# Patient Record
Sex: Female | Born: 1985 | Hispanic: No | Marital: Married | Smoking: Never smoker
Health system: Southern US, Community
[De-identification: ages and names within clinical notes are randomized; demographics above are authoritative.]

## PROBLEM LIST (undated history)

## (undated) DIAGNOSIS — E039 Hypothyroidism, unspecified: Secondary | ICD-10-CM

## (undated) DIAGNOSIS — O34219 Maternal care for unspecified type scar from previous cesarean delivery: Secondary | ICD-10-CM

## (undated) DIAGNOSIS — O9902 Anemia complicating childbirth: Secondary | ICD-10-CM

---

## 2010-11-30 LAB — GC/CHLAMYDIA PROBE AMP, GENITAL: Chlamydia: NEGATIVE

## 2010-12-16 LAB — GC/CHLAMYDIA PROBE AMP, GENITAL: Gonorrhea: NEGATIVE

## 2011-03-05 LAB — STREP B DNA PROBE: GBS: POSITIVE

## 2011-04-02 ENCOUNTER — Other Ambulatory Visit: Payer: Self-pay | Admitting: Obstetrics & Gynecology

## 2011-04-02 ENCOUNTER — Encounter (HOSPITAL_COMMUNITY): Payer: Self-pay

## 2011-04-02 ENCOUNTER — Inpatient Hospital Stay (HOSPITAL_COMMUNITY)
Admission: RE | Admit: 2011-04-02 | Discharge: 2011-04-07 | DRG: 765 | Disposition: A | Discharge status: Home / Self Care | Payer: 59 | Source: Ambulatory Visit | Attending: Obstetrics & Gynecology | Admitting: Obstetrics & Gynecology

## 2011-04-02 DIAGNOSIS — O339 Maternal care for disproportion, unspecified: Secondary | ICD-10-CM | POA: Diagnosis present

## 2011-04-02 DIAGNOSIS — O9903 Anemia complicating the puerperium: Secondary | ICD-10-CM | POA: Diagnosis not present

## 2011-04-02 DIAGNOSIS — K838 Other specified diseases of biliary tract: Secondary | ICD-10-CM | POA: Diagnosis present

## 2011-04-02 DIAGNOSIS — K831 Obstruction of bile duct: Secondary | ICD-10-CM | POA: Diagnosis present

## 2011-04-02 DIAGNOSIS — O26619 Liver and biliary tract disorders in pregnancy, unspecified trimester: Secondary | ICD-10-CM | POA: Diagnosis present

## 2011-04-02 DIAGNOSIS — O99892 Other specified diseases and conditions complicating childbirth: Secondary | ICD-10-CM | POA: Diagnosis present

## 2011-04-02 DIAGNOSIS — O26649 Intrahepatic cholestasis of pregnancy, unspecified trimester: Secondary | ICD-10-CM | POA: Diagnosis present

## 2011-04-02 DIAGNOSIS — Z22330 Carrier of Group B streptococcus: Secondary | ICD-10-CM

## 2011-04-02 DIAGNOSIS — O324XX0 Maternal care for high head at term, not applicable or unspecified: Secondary | ICD-10-CM | POA: Diagnosis present

## 2011-04-02 DIAGNOSIS — O330 Maternal care for disproportion due to deformity of maternal pelvic bones: Secondary | ICD-10-CM | POA: Diagnosis present

## 2011-04-02 DIAGNOSIS — D62 Acute posthemorrhagic anemia: Secondary | ICD-10-CM | POA: Diagnosis not present

## 2011-04-02 DIAGNOSIS — O9902 Anemia complicating childbirth: Secondary | ICD-10-CM | POA: Diagnosis present

## 2011-04-02 HISTORY — DX: Anemia complicating childbirth: O99.02

## 2011-04-02 LAB — CBC
Hemoglobin: 9.9 g/dL — ABNORMAL LOW (ref 12.0–15.0)
MCH: 23.2 pg — ABNORMAL LOW (ref 26.0–34.0)
RBC: 4.27 MIL/uL (ref 3.87–5.11)

## 2011-04-02 LAB — COMPREHENSIVE METABOLIC PANEL
ALT: 81 U/L — ABNORMAL HIGH (ref 0–35)
Alkaline Phosphatase: 265 U/L — ABNORMAL HIGH (ref 39–117)
CO2: 24 mEq/L (ref 19–32)
Calcium: 9.9 mg/dL (ref 8.4–10.5)
Chloride: 102 mEq/L (ref 96–112)
GFR calc Af Amer: >90 mL/min (ref >90)
GFR calc non Af Amer: >90 mL/min (ref >90)
Glucose, Bld: 97 mg/dL (ref 70–99)
Potassium: 4.1 mEq/L (ref 3.5–5.1)
Sodium: 135 mEq/L (ref 135–145)
Total Bilirubin: 0.2 mg/dL — ABNORMAL LOW (ref 0.3–1.2)

## 2011-04-02 LAB — RPR: RPR Ser Ql: NONREACTIVE

## 2011-04-02 MED ORDER — LIDOCAINE HCL 1.5 % IJ SOLN
INTRAMUSCULAR | Status: DC | PRN
  Administered 2011-04-02 (×3): 4 mL via INTRADERMAL

## 2011-04-02 MED ORDER — PENICILLIN G POTASSIUM 5000000 UNITS IJ SOLR
2.5000 10*6.[IU] | INTRAVENOUS | Status: DC
  Administered 2011-04-02 – 2011-04-03 (×6): 2.5 10*6.[IU] via INTRAVENOUS
  Filled 2011-04-02 (×8): qty 2.5

## 2011-04-02 MED ORDER — ACETAMINOPHEN 325 MG PO TABS: 650.0000 mg | ORAL_TABLET | ORAL | Status: DC | PRN

## 2011-04-02 MED ORDER — LACTATED RINGERS IV SOLN
INTRAVENOUS | Status: DC
  Administered 2011-04-02 (×2): via INTRAVENOUS
  Administered 2011-04-02: 300 mL via INTRAVENOUS
  Administered 2011-04-02 – 2011-04-03 (×5): via INTRAVENOUS

## 2011-04-02 MED ORDER — NALBUPHINE SYRINGE 5 MG/0.5 ML
10.0000 mg | INJECTION | INTRAMUSCULAR | Status: DC | PRN
  Administered 2011-04-02: 10 mg via INTRAVENOUS
  Filled 2011-04-02 (×2): qty 1

## 2011-04-02 MED ORDER — MISOPROSTOL 25 MCG QUARTER TABLET: 25.0000 ug | ORAL_TABLET | ORAL | Status: DC | PRN

## 2011-04-02 MED ORDER — PHENYLEPHRINE 40 MCG/ML (10ML) SYRINGE FOR IV PUSH (FOR BLOOD PRESSURE SUPPORT): 80.0000 ug | PREFILLED_SYRINGE | INTRAVENOUS | Status: DC | PRN

## 2011-04-02 MED ORDER — URSODIOL 300 MG PO CAPS
300.0000 mg | ORAL_CAPSULE | Freq: Three times a day (TID) | ORAL | Status: DC
  Administered 2011-04-02 (×2): 300 mg via ORAL
  Filled 2011-04-02 (×7): qty 1

## 2011-04-02 MED ORDER — OXYTOCIN 20 UNITS IN LACTATED RINGERS INFUSION - SIMPLE
1.0000 m[IU]/min | INTRAVENOUS | Status: DC
  Administered 2011-04-02: 9 m[IU]/min via INTRAVENOUS
  Administered 2011-04-02: 1 m[IU]/min via INTRAVENOUS
  Administered 2011-04-02: 8 m[IU]/min via INTRAVENOUS
  Administered 2011-04-02: 7 m[IU]/min via INTRAVENOUS
  Administered 2011-04-02: 6 m[IU]/min via INTRAVENOUS
  Filled 2011-04-02: qty 1000

## 2011-04-02 MED ORDER — OXYTOCIN 20 UNITS IN LACTATED RINGERS INFUSION - SIMPLE: 125.0000 mL/h | Freq: Once | INTRAVENOUS | Status: DC

## 2011-04-02 MED ORDER — LACTATED RINGERS IV SOLN: 500.0000 mL | INTRAVENOUS | Status: DC | PRN

## 2011-04-02 MED ORDER — OXYTOCIN BOLUS FROM INFUSION
500.0000 mL | Freq: Once | INTRAVENOUS | Status: DC
  Filled 2011-04-02: qty 500

## 2011-04-02 MED ORDER — ONDANSETRON HCL 4 MG/2ML IJ SOLN: 4.0000 mg | Freq: Four times a day (QID) | INTRAMUSCULAR | Status: DC | PRN

## 2011-04-02 MED ORDER — DIPHENHYDRAMINE HCL 50 MG/ML IJ SOLN
25.0000 mg | Freq: Four times a day (QID) | INTRAMUSCULAR | Status: DC | PRN
  Filled 2011-04-02: qty 1

## 2011-04-02 MED ORDER — FLEET ENEMA 7-19 GM/118ML RE ENEM: 1.0000 | ENEMA | RECTAL | Status: DC | PRN

## 2011-04-02 MED ORDER — IBUPROFEN 600 MG PO TABS: 600.0000 mg | ORAL_TABLET | Freq: Four times a day (QID) | ORAL | Status: DC | PRN

## 2011-04-02 MED ORDER — LIDOCAINE HCL (PF) 1 % IJ SOLN
30.0000 mL | INTRAMUSCULAR | Status: DC | PRN
  Filled 2011-04-02: qty 30

## 2011-04-02 MED ORDER — EPHEDRINE 5 MG/ML INJ: 10.0000 mg | INTRAVENOUS | Status: DC | PRN

## 2011-04-02 MED ORDER — NALBUPHINE HCL 10 MG/ML IJ SOLN: 10.0000 mg | INTRAMUSCULAR | Status: DC | PRN

## 2011-04-02 MED ORDER — CITRIC ACID-SODIUM CITRATE 334-500 MG/5ML PO SOLN
30.0000 mL | ORAL | Status: DC | PRN
  Administered 2011-04-03: 30 mL via ORAL
  Filled 2011-04-02: qty 15

## 2011-04-02 MED ORDER — OXYCODONE-ACETAMINOPHEN 5-325 MG PO TABS: 1.0000 | ORAL_TABLET | ORAL | Status: DC | PRN

## 2011-04-02 MED ORDER — PHENYLEPHRINE 40 MCG/ML (10ML) SYRINGE FOR IV PUSH (FOR BLOOD PRESSURE SUPPORT)
80.0000 ug | PREFILLED_SYRINGE | INTRAVENOUS | Status: DC | PRN
  Filled 2011-04-02 (×2): qty 5

## 2011-04-02 MED ORDER — PENICILLIN G POTASSIUM 5000000 UNITS IJ SOLR
5.0000 10*6.[IU] | Freq: Once | INTRAVENOUS | Status: AC
  Administered 2011-04-02: 5 10*6.[IU] via INTRAVENOUS
  Filled 2011-04-02: qty 5

## 2011-04-02 MED ORDER — DIPHENHYDRAMINE HCL 50 MG/ML IJ SOLN
12.5000 mg | INTRAMUSCULAR | Status: DC | PRN
  Administered 2011-04-02 (×2): 12.5 mg via INTRAVENOUS

## 2011-04-02 MED ORDER — FENTANYL 2.5 MCG/ML BUPIVACAINE 1/10 % EPIDURAL INFUSION (WH - ANES)
14.0000 mL/h | INTRAMUSCULAR | Status: DC
  Administered 2011-04-02 – 2011-04-03 (×5): 14 mL/h via EPIDURAL
  Filled 2011-04-02 (×5): qty 60

## 2011-04-02 MED ORDER — TERBUTALINE SULFATE 1 MG/ML IJ SOLN: 0.2500 mg | Freq: Once | INTRAMUSCULAR | Status: AC | PRN

## 2011-04-02 MED ORDER — EPHEDRINE 5 MG/ML INJ
10.0000 mg | INTRAVENOUS | Status: DC | PRN
  Administered 2011-04-02: 10 mg via INTRAVENOUS
  Filled 2011-04-02: qty 4

## 2011-04-02 MED ORDER — LACTATED RINGERS IV SOLN
500.0000 mL | Freq: Once | INTRAVENOUS | Status: AC
  Administered 2011-04-02: 1000 mL via INTRAVENOUS

## 2011-04-02 NOTE — Progress Notes (Signed)
This note also relates to the following rows which could not be included: SpO2 - Cannot attach notes to unvalidated device data Ephedrine given iv (see mar) for low b/p, pt put in supine position and IV bolus started

## 2011-04-02 NOTE — Progress Notes (Signed)
Patient ID: Chelsea Woods, female   DOB: 1985/10/06, 26 y.o.   MRN: [id number] Nubain once, c/o pain, wants more nubain, falls asleep in b/w UCs. Labs reviewed, VS reviewed. FHT 140/ + accels/ no decels/ moderate variability - reassuring, toco q 2-4 min, pitocin. SVE 2/80%/-1/Vtx. GBS+, on PCN. Will proceed w epidural and continue pitocin.

## 2011-04-02 NOTE — Progress Notes (Signed)
Subjective: Doing well, pain mild, UCs q3-5 min Anesthesia none so far  Objective: BP 125/64  Pulse 88  Temp(Src) 98.4 F (36.9 C) (Oral)  Resp 18  Ht 5\' 4"  (1.626 m)  Wt 74.39 kg (164 lb)  BMI 28.15 kg/m2   FHT:  FHR: 140 bpm, variability: moderate,  accelerations:  Present,  decelerations:  Absent UC:   irregular, every 3-5 minutes VE:   Dilation: 1 Effacement (%): 50 Station: -1 Exam by:: Dr. Seymour Bars AROM AF clear ++  Assessment / Plan: Induction of labor due to Severe Cholestasis of pregnancy,  progressing well on pitocin  Fetal Wellbeing:  Category I Pain Control:  Labor support without medications  Anticipated MOD:  NSVD  Kindell Strada,MARIE-LYNE 04/02/2011, 12:05 PM

## 2011-04-02 NOTE — Anesthesia Preprocedure Evaluation (Signed)
Anesthesia Evaluation  Patient identified by MRN, date of birth, ID band Patient awake    Reviewed: Allergy & Precautions, H&P , NPO status , Patient's Chart, lab work & pertinent test results, reviewed documented beta blocker date and time   History of Anesthesia Complications Negative for: history of anesthetic complications  Airway Mallampati: III TM Distance: >3 FB Neck ROM: full    Dental  (+) Teeth Intact   Pulmonary neg pulmonary ROS,  clear to auscultation        Cardiovascular neg cardio ROS regular Normal    Neuro/Psych Negative Neurological ROS  Negative Psych ROS   GI/Hepatic negative GI ROS, Cholestasis - severe itching and increased LFTs   Endo/Other  Negative Endocrine ROS  Renal/GU negative Renal ROS  Genitourinary negative   Musculoskeletal   Abdominal   Peds  Hematology negative hematology ROS (+)   Anesthesia Other Findings   Reproductive/Obstetrics (+) Pregnancy                           Anesthesia Physical Anesthesia Plan  ASA: II  Anesthesia Plan: Epidural   Post-op Pain Management:    Induction:   Airway Management Planned:   Additional Equipment:   Intra-op Plan:   Post-operative Plan:   Informed Consent: I have reviewed the patients History and Physical, chart, labs and discussed the procedure including the risks, benefits and alternatives for the proposed anesthesia with the patient or authorized representative who has indicated his/her understanding and acceptance.     Plan Discussed with:   Anesthesia Plan Comments:         Anesthesia Quick Evaluation

## 2011-04-02 NOTE — Progress Notes (Signed)
Called Dr. Juliene Pina to report cervical exam, uc pattern, fhr, and pitocin.  No new orders at this time.

## 2011-04-02 NOTE — Anesthesia Procedure Notes (Signed)
Epidural Patient location during procedure: OB Start time: 04/02/2011 6:42 PM Reason for block: procedure for pain  Staffing Performed by: anesthesiologist   Preanesthetic Checklist Completed: patient identified, site marked, surgical consent, pre-op evaluation, timeout performed, IV checked, risks and benefits discussed and monitors and equipment checked  Epidural Patient position: sitting Prep: site prepped and draped and DuraPrep Patient monitoring: continuous pulse ox and blood pressure Approach: midline Injection technique: LOR air  Needle:  Needle type: Tuohy  Needle gauge: 17 G Needle length: 9 cm Catheter type: closed end flexible Catheter size: 19 Gauge Test dose: negative  Assessment Events: blood not aspirated, injection not painful, no injection resistance, negative IV test and no paresthesia  Additional Notes Discussed risk of headache, infection, bleeding, nerve injury and failed or incomplete block.  Patient voices understanding and wishes to proceed.

## 2011-04-02 NOTE — Progress Notes (Signed)
This note also relates to the following rows which could not be included: SpO2 - Cannot attach notes to unvalidated device data Pt feeling very itchy.  See (MAR)

## 2011-04-02 NOTE — H&P (Signed)
Chelsea Woods is a 26 y.o. female G1 presenting for IOL at 37.1/7 wks for cholestasis of pregnancy. Seen at 36.4/7 wks for intense itching, what was thought to be PCN allergy since it was started for GBS in urine but had tolerated Keflex in past. Bile acid returned the following day at 102. Pt was called back, BPP done 8/8. Itching got worse, so IOL was planned at 37.1/7 wks though cervix is not favorable due to high risk of IUFD and other neonatal complications. PIH labs and CMP sent 04/01/11, results pending.   ROS- good FMs, no vag blding, no leaking. Off and on UCs. No HA/blurred vision/ TUQ pain.  P[facility] at [employer] [facility] since 8 wks. Recurrent UTI before pregnancy and 2 episodes in pregnancy, wt gain ++ but slowed down after 34 wks (total 40 lbs) but sono AGA.  PMH- neg except UTIs since sexually active PSH- neg Allergy- ??PCN, but itching was indeed from ICH and she has tolerated Keflex in past SocH- married, never smoked, no etoh/ drugs FamHx- neg.    There were no vitals taken for this visit. Physical exam:  A&O x 3, no acute distress. Pleasant HEENT neg, no thyromegaly Lungs CTA bilat CV RRR, S1S2 normal Abdo soft, non tender, non acute Extr no edema/ tenderness Pelvic closed cx/ soft 50%/ vtx/-1 FHT  130s/ + accels/ no decels/ mod variab- reassuring Toco occ.  Prenatal labs: ABO, Rh:  B negative Antibody:  neg Rubella:  Immune RPR:   Neg x2 HBsAg:   Neg HIV:   Neg GBS:   (+), start PCN in labor, if rash, will switch to Ancef since tolerated Keflex in past. Glucose screen- 1 hr abn, 3 hr - normal Genetic screening negative Anatomy US normal  Assessment/Plan: Intrahepatic cholestasis with high bile acids in 102. IOL due to high fetal/neonatal mortality/ morbidity.  GBS+, PCN in labor Epidural after 3 cm.  Glyn Gerads R 04/02/2011, 7:00 AM

## 2011-04-03 ENCOUNTER — Inpatient Hospital Stay (HOSPITAL_COMMUNITY): Payer: 59 | Admitting: Anesthesiology

## 2011-04-03 ENCOUNTER — Encounter (HOSPITAL_COMMUNITY)
Admission: RE | Disposition: A | Discharge status: Home / Self Care | Payer: Self-pay | Source: Ambulatory Visit | Attending: Obstetrics & Gynecology

## 2011-04-03 ENCOUNTER — Encounter (HOSPITAL_COMMUNITY): Payer: Self-pay | Admitting: Anesthesiology

## 2011-04-03 ENCOUNTER — Other Ambulatory Visit: Payer: Self-pay | Admitting: Obstetrics & Gynecology

## 2011-04-03 LAB — ABO/RH: ABO/RH(D): B NEG

## 2011-04-03 SURGERY — Surgical Case
Anesthesia: Regional | Site: Abdomen | Wound class: Clean Contaminated

## 2011-04-03 MED ORDER — ONDANSETRON HCL 4 MG/2ML IJ SOLN
INTRAMUSCULAR | Status: AC
  Filled 2011-04-03: qty 2

## 2011-04-03 MED ORDER — ONDANSETRON HCL 4 MG/2ML IJ SOLN
INTRAMUSCULAR | Status: DC | PRN
  Administered 2011-04-03: 4 mg via INTRAVENOUS

## 2011-04-03 MED ORDER — SCOPOLAMINE 1 MG/3DAYS TD PT72
1.0000 | MEDICATED_PATCH | Freq: Once | TRANSDERMAL | Status: AC
  Administered 2011-04-03: 1.5 mg via TRANSDERMAL

## 2011-04-03 MED ORDER — SODIUM CHLORIDE 0.9 % IJ SOLN
3.0000 mL | INTRAMUSCULAR | Status: DC | PRN
  Administered 2011-04-05: 3 mL via INTRAVENOUS

## 2011-04-03 MED ORDER — KETOROLAC TROMETHAMINE 60 MG/2ML IM SOLN
INTRAMUSCULAR | Status: AC
  Administered 2011-04-03: 60 mg via INTRAMUSCULAR
  Filled 2011-04-03: qty 2

## 2011-04-03 MED ORDER — NALOXONE HCL 0.4 MG/ML IJ SOLN: 0.4000 mg | INTRAMUSCULAR | Status: DC | PRN

## 2011-04-03 MED ORDER — LACTATED RINGERS IV SOLN
INTRAVENOUS | Status: DC
  Administered 2011-04-03 – 2011-04-04 (×2): via INTRAVENOUS

## 2011-04-03 MED ORDER — PRENATAL MULTIVITAMIN CH
1.0000 | ORAL_TABLET | Freq: Every day | ORAL | Status: DC
  Administered 2011-04-04 – 2011-04-07 (×4): 1 via ORAL
  Filled 2011-04-03 (×4): qty 1

## 2011-04-03 MED ORDER — IBUPROFEN 600 MG PO TABS
600.0000 mg | ORAL_TABLET | Freq: Four times a day (QID) | ORAL | Status: DC | PRN
  Filled 2011-04-03 (×10): qty 1

## 2011-04-03 MED ORDER — NITROGLYCERIN 0.4 MG SL SUBL
SUBLINGUAL_TABLET | SUBLINGUAL | Status: DC | PRN
  Administered 2011-04-03: 0.4 mg via SUBLINGUAL

## 2011-04-03 MED ORDER — CEFAZOLIN SODIUM 1-5 GM-% IV SOLN
INTRAVENOUS | Status: AC
  Filled 2011-04-03: qty 100

## 2011-04-03 MED ORDER — SCOPOLAMINE 1 MG/3DAYS TD PT72
MEDICATED_PATCH | TRANSDERMAL | Status: AC
  Administered 2011-04-03: 1.5 mg via TRANSDERMAL
  Filled 2011-04-03: qty 1

## 2011-04-03 MED ORDER — CEFAZOLIN SODIUM-DEXTROSE 2-3 GM-% IV SOLR
2.0000 g | INTRAVENOUS | Status: AC
  Administered 2011-04-03: 2 g via INTRAVENOUS
  Filled 2011-04-03: qty 50

## 2011-04-03 MED ORDER — MENTHOL 3 MG MT LOZG: 1.0000 | LOZENGE | OROMUCOSAL | Status: DC | PRN

## 2011-04-03 MED ORDER — DIPHENHYDRAMINE HCL 25 MG PO CAPS: 25.0000 mg | ORAL_CAPSULE | Freq: Four times a day (QID) | ORAL | Status: DC | PRN

## 2011-04-03 MED ORDER — DIPHENHYDRAMINE HCL 50 MG/ML IJ SOLN: 25.0000 mg | INTRAMUSCULAR | Status: DC | PRN

## 2011-04-03 MED ORDER — PHENYLEPHRINE 40 MCG/ML (10ML) SYRINGE FOR IV PUSH (FOR BLOOD PRESSURE SUPPORT)
PREFILLED_SYRINGE | INTRAVENOUS | Status: AC
  Filled 2011-04-03: qty 5

## 2011-04-03 MED ORDER — IBUPROFEN 600 MG PO TABS
600.0000 mg | ORAL_TABLET | Freq: Four times a day (QID) | ORAL | Status: DC
  Administered 2011-04-04 – 2011-04-07 (×14): 600 mg via ORAL
  Filled 2011-04-03 (×5): qty 1

## 2011-04-03 MED ORDER — NALBUPHINE HCL 10 MG/ML IJ SOLN
5.0000 mg | INTRAMUSCULAR | Status: DC | PRN
  Filled 2011-04-03: qty 1

## 2011-04-03 MED ORDER — KETOROLAC TROMETHAMINE 30 MG/ML IJ SOLN: 30.0000 mg | Freq: Four times a day (QID) | INTRAMUSCULAR | Status: AC | PRN

## 2011-04-03 MED ORDER — FENTANYL CITRATE 0.05 MG/ML IJ SOLN: 25.0000 ug | INTRAMUSCULAR | Status: DC | PRN

## 2011-04-03 MED ORDER — ONDANSETRON HCL 4 MG/2ML IJ SOLN: 4.0000 mg | Freq: Three times a day (TID) | INTRAMUSCULAR | Status: DC | PRN

## 2011-04-03 MED ORDER — URSODIOL 300 MG PO CAPS
300.0000 mg | ORAL_CAPSULE | Freq: Two times a day (BID) | ORAL | Status: DC
  Administered 2011-04-04 – 2011-04-07 (×7): 300 mg via ORAL
  Filled 2011-04-03 (×11): qty 1

## 2011-04-03 MED ORDER — METOCLOPRAMIDE HCL 5 MG/ML IJ SOLN: 10.0000 mg | Freq: Three times a day (TID) | INTRAMUSCULAR | Status: DC | PRN

## 2011-04-03 MED ORDER — WITCH HAZEL-GLYCERIN EX PADS: 1.0000 "application " | MEDICATED_PAD | CUTANEOUS | Status: DC | PRN

## 2011-04-03 MED ORDER — OXYTOCIN 10 UNIT/ML IJ SOLN
INTRAMUSCULAR | Status: AC
  Filled 2011-04-03: qty 2

## 2011-04-03 MED ORDER — MORPHINE SULFATE (PF) 0.5 MG/ML IJ SOLN
INTRAMUSCULAR | Status: DC | PRN
  Administered 2011-04-03: 4 mg via EPIDURAL

## 2011-04-03 MED ORDER — DIBUCAINE 1 % RE OINT: 1.0000 "application " | TOPICAL_OINTMENT | RECTAL | Status: DC | PRN

## 2011-04-03 MED ORDER — DIPHENHYDRAMINE HCL 50 MG/ML IJ SOLN
12.5000 mg | INTRAMUSCULAR | Status: DC | PRN
  Administered 2011-04-03: 12.5 mg via INTRAVENOUS
  Filled 2011-04-03: qty 1

## 2011-04-03 MED ORDER — NITROGLYCERIN 0.4 MG/SPRAY TL SOLN
Status: AC
  Filled 2011-04-03: qty 4.9

## 2011-04-03 MED ORDER — ONDANSETRON HCL 4 MG/2ML IJ SOLN
4.0000 mg | INTRAMUSCULAR | Status: DC | PRN
  Administered 2011-04-03: 4 mg via INTRAVENOUS
  Filled 2011-04-03: qty 2

## 2011-04-03 MED ORDER — KETOROLAC TROMETHAMINE 60 MG/2ML IM SOLN
60.0000 mg | Freq: Once | INTRAMUSCULAR | Status: AC | PRN
  Administered 2011-04-03: 60 mg via INTRAMUSCULAR

## 2011-04-03 MED ORDER — PHENYLEPHRINE HCL 10 MG/ML IJ SOLN
INTRAMUSCULAR | Status: DC | PRN
  Administered 2011-04-03: 50 ug via INTRAVENOUS

## 2011-04-03 MED ORDER — ZOLPIDEM TARTRATE 5 MG PO TABS: 5.0000 mg | ORAL_TABLET | Freq: Every evening | ORAL | Status: DC | PRN

## 2011-04-03 MED ORDER — OXYCODONE-ACETAMINOPHEN 5-325 MG PO TABS
1.0000 | ORAL_TABLET | ORAL | Status: DC | PRN
  Administered 2011-04-04: 2 via ORAL
  Administered 2011-04-04 – 2011-04-06 (×9): 1 via ORAL
  Administered 2011-04-07: 2 via ORAL
  Filled 2011-04-03 (×6): qty 1
  Filled 2011-04-03: qty 2
  Filled 2011-04-03 (×2): qty 1
  Filled 2011-04-03: qty 2
  Filled 2011-04-03: qty 1

## 2011-04-03 MED ORDER — SODIUM CHLORIDE 0.9 % IV SOLN
1.0000 ug/kg/h | INTRAVENOUS | Status: DC | PRN
  Filled 2011-04-03: qty 2.5

## 2011-04-03 MED ORDER — SODIUM BICARBONATE 8.4 % IV SOLN
INTRAVENOUS | Status: DC | PRN
  Administered 2011-04-03: 5 mL via EPIDURAL

## 2011-04-03 MED ORDER — OXYTOCIN 10 UNIT/ML IJ SOLN
INTRAMUSCULAR | Status: AC
  Filled 2011-04-03: qty 4

## 2011-04-03 MED ORDER — ONDANSETRON HCL 4 MG PO TABS: 4.0000 mg | ORAL_TABLET | ORAL | Status: DC | PRN

## 2011-04-03 MED ORDER — EPHEDRINE 5 MG/ML INJ
INTRAVENOUS | Status: AC
  Filled 2011-04-03: qty 10

## 2011-04-03 MED ORDER — DIPHENHYDRAMINE HCL 25 MG PO CAPS
25.0000 mg | ORAL_CAPSULE | ORAL | Status: DC | PRN
  Filled 2011-04-03: qty 1

## 2011-04-03 MED ORDER — CEFAZOLIN SODIUM 1-5 GM-% IV SOLN
1.0000 g | Freq: Three times a day (TID) | INTRAVENOUS | Status: AC
  Administered 2011-04-03 – 2011-04-04 (×2): 1 g via INTRAVENOUS
  Filled 2011-04-03 (×2): qty 50

## 2011-04-03 MED ORDER — OXYTOCIN 10 UNIT/ML IJ SOLN
INTRAMUSCULAR | Status: DC | PRN
  Administered 2011-04-03: 40 [IU]
  Administered 2011-04-03: 20 [IU] via INTRAMUSCULAR

## 2011-04-03 MED ORDER — LANOLIN HYDROUS EX OINT: 1.0000 "application " | TOPICAL_OINTMENT | CUTANEOUS | Status: DC | PRN

## 2011-04-03 MED ORDER — SODIUM BICARBONATE 8.4 % IV SOLN
INTRAVENOUS | Status: AC
  Filled 2011-04-03: qty 50

## 2011-04-03 MED ORDER — MEPERIDINE HCL 25 MG/ML IJ SOLN: 6.2500 mg | INTRAMUSCULAR | Status: DC | PRN

## 2011-04-03 MED ORDER — SENNOSIDES-DOCUSATE SODIUM 8.6-50 MG PO TABS
2.0000 | ORAL_TABLET | Freq: Every day | ORAL | Status: DC
  Administered 2011-04-04 – 2011-04-05 (×2): 2 via ORAL

## 2011-04-03 MED ORDER — TETANUS-DIPHTH-ACELL PERTUSSIS 5-2.5-18.5 LF-MCG/0.5 IM SUSP
0.5000 mL | Freq: Once | INTRAMUSCULAR | Status: AC
  Administered 2011-04-07: 0.5 mL via INTRAMUSCULAR
  Filled 2011-04-03: qty 0.5

## 2011-04-03 MED ORDER — SIMETHICONE 80 MG PO CHEW: 80.0000 mg | CHEWABLE_TABLET | ORAL | Status: DC | PRN

## 2011-04-03 MED ORDER — SODIUM CHLORIDE 0.9 % IR SOLN
Status: DC | PRN
  Administered 2011-04-03: 1000 mL

## 2011-04-03 MED ORDER — LIDOCAINE-EPINEPHRINE (PF) 2 %-1:200000 IJ SOLN
INTRAMUSCULAR | Status: AC
  Filled 2011-04-03: qty 20

## 2011-04-03 MED ORDER — EPHEDRINE SULFATE 50 MG/ML IJ SOLN
INTRAMUSCULAR | Status: DC | PRN
  Administered 2011-04-03: 5 mg via INTRAVENOUS
  Administered 2011-04-03: 10 mg via INTRAVENOUS
  Administered 2011-04-03: 25 mg via INTRAVENOUS

## 2011-04-03 MED ORDER — MORPHINE SULFATE 0.5 MG/ML IJ SOLN
INTRAMUSCULAR | Status: AC
  Filled 2011-04-03: qty 10

## 2011-04-03 MED ORDER — SIMETHICONE 80 MG PO CHEW
80.0000 mg | CHEWABLE_TABLET | Freq: Three times a day (TID) | ORAL | Status: DC
  Administered 2011-04-04 – 2011-04-07 (×10): 80 mg via ORAL

## 2011-04-03 MED ORDER — OXYTOCIN 20 UNITS IN LACTATED RINGERS INFUSION - SIMPLE: 125.0000 mL/h | INTRAVENOUS | Status: AC

## 2011-04-03 SURGICAL SUPPLY — 40 items
BENZOIN TINCTURE PRP APPL 2/3 (GAUZE/BANDAGES/DRESSINGS) IMPLANT
CHLORAPREP W/TINT 26ML (MISCELLANEOUS) ×2 IMPLANT
CLOTH BEACON ORANGE TIMEOUT ST (SAFETY) ×2 IMPLANT
CONTAINER PREFILL 10% NBF 15ML (MISCELLANEOUS) IMPLANT
DRESSING TELFA 8X3 (GAUZE/BANDAGES/DRESSINGS) IMPLANT
DRSG COVADERM 4X10 (GAUZE/BANDAGES/DRESSINGS) ×2 IMPLANT
DRSG PAD ABDOMINAL 8X10 ST (GAUZE/BANDAGES/DRESSINGS) ×2 IMPLANT
ELECT REM PT RETURN 9FT ADLT (ELECTROSURGICAL) ×2
ELECTRODE REM PT RTRN 9FT ADLT (ELECTROSURGICAL) ×1 IMPLANT
EXTRACTOR VACUUM KIWI (MISCELLANEOUS) IMPLANT
EXTRACTOR VACUUM M CUP 4 TUBE (SUCTIONS) ×2 IMPLANT
GAUZE SPONGE 4X4 12PLY STRL LF (GAUZE/BANDAGES/DRESSINGS) IMPLANT
GLOVE BIO SURGEON STRL SZ7 (GLOVE) ×4 IMPLANT
GLOVE BIOGEL PI IND STRL 7.0 (GLOVE) ×2 IMPLANT
GLOVE BIOGEL PI INDICATOR 7.0 (GLOVE) ×2
GOWN PREVENTION PLUS LG XLONG (DISPOSABLE) ×6 IMPLANT
KIT ABG SYR 3ML LUER SLIP (SYRINGE) IMPLANT
NEEDLE HYPO 25X5/8 SAFETYGLIDE (NEEDLE) IMPLANT
NS IRRIG 1000ML POUR BTL (IV SOLUTION) ×2 IMPLANT
PACK C SECTION WH (CUSTOM PROCEDURE TRAY) ×2 IMPLANT
PAD ABD 7.5X8 STRL (GAUZE/BANDAGES/DRESSINGS) IMPLANT
RTRCTR C-SECT PINK 25CM LRG (MISCELLANEOUS) IMPLANT
SLEEVE SCD COMPRESS KNEE MED (MISCELLANEOUS) ×2 IMPLANT
STAPLER VISISTAT 35W (STAPLE) IMPLANT
STRIP CLOSURE SKIN 1/4X4 (GAUZE/BANDAGES/DRESSINGS) IMPLANT
SUT MNCRL 0 VIOLET CTX 36 (SUTURE) ×3 IMPLANT
SUT MONOCRYL 0 CTX 36 (SUTURE) ×3
SUT PLAIN 0 NONE (SUTURE) IMPLANT
SUT PLAIN 2 0 (SUTURE)
SUT PLAIN ABS 2-0 CT1 27XMFL (SUTURE) IMPLANT
SUT VIC AB 0 CT1 27 (SUTURE) ×2
SUT VIC AB 0 CT1 27XBRD ANBCTR (SUTURE) ×2 IMPLANT
SUT VIC AB 2-0 CT1 27 (SUTURE) ×2
SUT VIC AB 2-0 CT1 TAPERPNT 27 (SUTURE) ×2 IMPLANT
SUT VIC AB 4-0 KS 27 (SUTURE) IMPLANT
SUT VICRYL 0 TIES 12 18 (SUTURE) IMPLANT
TAPE CLOTH SURG 4X10 WHT LF (GAUZE/BANDAGES/DRESSINGS) ×2 IMPLANT
TOWEL OR 17X24 6PK STRL BLUE (TOWEL DISPOSABLE) ×4 IMPLANT
TRAY FOLEY CATH 14FR (SET/KITS/TRAYS/PACK) IMPLANT
WATER STERILE IRR 1000ML POUR (IV SOLUTION) ×2 IMPLANT

## 2011-04-03 NOTE — Progress Notes (Signed)
Patient ID: Chelsea Woods, female   DOB: [birth year], 28 y.o.   MRN: [id number] Pt assessed at 7 am, feels rectal pressure. FHT 150s/+ accels/ no decels/ moderate variability, category I. Toco q 2-3 min with pitocin. SVE 8cm /100%/ Vtx-OP with caput and moulding at 0 to +1 station. Clear AF.  VS stable. Afebrile. GBS prophylaxis ongoing.  Plan: Exaggerated Sims. Cont pitocin and pain mngmt with epidural.

## 2011-04-03 NOTE — Op Note (Signed)
Primary Low Transverse Cesarean Section Procedure Note  Chelsea Woods  04/03/2011   Indications: Labor induction at 37.1/7 wks for Intrahepatic cholestasis of pregnancy. Progressed well to complete but arrest of descent and rotation with persistant OP position for 2 hrs.    Pre-operative Diagnosis: Arrest of descent, OP position  Post-operative Diagnosis: Same  Surgeon: Surgeon(s) and Role: Robley Fries, MD Assistants: Arlan Organ, CNM Anesthesia: Epidural    Procedure Details:  The patient was evaluated on labor room and recommended cesarean section for delivery. Risks/complications of surgery reviewed incl infection, bleeding, damage to internal organs including bladder, bowels, ureters, blood vessels, other risks from anesthesia, VTE and delayed complications of any surgery, complications in future surgery reviewed. Also discussed neonatal complications incl difficult delivery, laceration, vacuum assistance, TTN etc. The patient concurred with the proposed plan, giving informed consent. identified as Chelsea Woods and the procedure verified as C-Section Delivery.  She was brought to the operating room with IV running and received 2 gm Ancef. A Time Out was held and the above information confirmed. Foley was noted to have slight blood tinged urine but was draining well. Epidural was noted to be adequate, patient was prepped and draped in the usual sterile manner. A Pfannenstiel incision was made and carried down through the subcutaneous tissue to the fascia. Fascial incision was made and extended transversely. The fascia was separated from the underlying rectus tissue superiorly and inferiorly. The peritoneum was identified and entered. Peritoneal incision was extended longitudinally. The utero-vesical peritoneal reflection was incised transversely and the bladder flap was bluntly freed from the lower uterine segment. A low transverse uterine incision was made. Baby was in direct OP position. Rotation  and fexion attempted failed several times as baby was wedged deep in pelvis with a large caput and Android pelvis. RN assisted with vaginal lift of the head and I was able to get below the head but rotation and flexion was still difficult. Uterus incision was extended up from left angle. At this point D.Renae Fickle (CNM) assisted with vaginal lift and that got the baby further up by the uterine incision and in LOA position from where head delivery was completed with one controlled vacuum assistance. Baby BOY was delivered at 11.33 am on 04/03/11 with Apgars 4 and 9 at 1 and 5 min. Cord clamped and cut, baby handed to NICU team. Cord ph(arterial) was 7.24 and cord blood was collected. Placenta was removed Intact and appeared normal. The uterine outline, tubes and ovaries appeared normal. The uterine incision was closed with running locked sutures of 0Vicryl in two layers.  Hemostasis was observed. Lavage was carried out until clear. Peritoneum closed with 2-0 Vicryl. Rectus muscles approximated at lower end of the incision. The fascia was then reapproximated with running sutures of 0Vicryl.  The skin was closed with staples and pressure dressing applied.  Instrument, sponge, and needle counts were correct prior the abdominal closure and were correct at the conclusion of the case.   Findings: Baby BOY in OP position with difficult delivery at c-section complete with vaginal lift by assistant and then one vacuum pull at uterine incision. Delivered at 11.33 am on 04/03/11 with APGARs 4 and 9 at 1 and 5 min. Cord pH 7.24. Baby weight 6 lb 10 oz.  Normal placenta, tubes, ovaries.     Estimated Blood Loss: 800 cc  Total IV Fluids: 1800 ml with additional bolus to clear hematuria  Urine Output: 300 cc, bloody, but clearing in foley tubing  Specimens:  cord blood, cod gas, placenta   Complications: no complications  Disposition: PACU - hemodynamically stable.   Maternal Condition: stable   Baby condition / location:   nursery-stable  Attending Attestation: I performed the procedure.   Signed: V.Leslieanne Cobarrubias, MD Surgeon(s): Robley Fries, MD

## 2011-04-03 NOTE — Transfer of Care (Signed)
Immediate Anesthesia Transfer of Care Note  Patient: Chelsea Woods  Procedure(s) Performed:  CESAREAN SECTION - Primary cesarean section with delivery of baby boy at 48. Apgars 4/9.  Patient Location: PACU  Anesthesia Type: Epidural  Level of Consciousness: awake, alert  and oriented  Airway & Oxygen Therapy: Patient Spontanous Breathing  Post-op Assessment: Report given to PACU RN and Post -op Vital signs reviewed and stable  Post vital signs: Reviewed and stable  Complications: No apparent anesthesia complications

## 2011-04-03 NOTE — Anesthesia Postprocedure Evaluation (Signed)
Anesthesia Post Note  Patient: Chelsea Woods  Procedure(s) Performed:  CESAREAN SECTION - Primary cesarean section with delivery of baby boy at 22. Apgars 4/9.  Anesthesia type: Epidural  Patient location: PACU  Post pain: Pain level controlled  Post assessment: Post-op Vital signs reviewed  Last Vitals:  Filed Vitals:   04/03/11 1315  BP: 137/73  Pulse: 87  Temp: 36.8 C  Resp: 16    Post vital signs: stable  Level of consciousness: awake  Complications: No apparent anesthesia complications

## 2011-04-03 NOTE — Anesthesia Postprocedure Evaluation (Signed)
  Anesthesia Post-op Note  Patient: Chelsea Woods  Procedure(s) Performed:  CESAREAN SECTION - Primary cesarean section with delivery of baby boy at 34. Apgars 4/9.  Patient Location: 132  Anesthesia Type: Epidural  Level of Consciousness: awake, alert  and oriented  Airway and Oxygen Therapy: Patient Spontanous Breathing  Post-op Pain: mild  Post-op Assessment: Post-op Vital signs reviewed, Patient's Cardiovascular Status Stable, No headache, No backache, No residual numbness and No residual motor weakness  Post-op Vital Signs: Reviewed and stable  Complications: No apparent anesthesia complications

## 2011-04-03 NOTE — Progress Notes (Signed)
Patient ID: Chelsea Woods, female   DOB: Feb 09, 1986, 26 y.o.   MRN: [id number] VS stable. FHT category I. SVE- ROT, moulding, caput with non rotation or descent below 0 to +1 station. Maternal exhaustion.  Complete at 8.10 am and pushing effectively since 9 am with no rotation or further descent except increased caput.  Will proceed with c-section.  Risks/complications of surgery reviewed incl infection, bleeding, damage to internal organs including bladder, bowels, ureters, blood vessels, other risks from anesthesia, VTE and delayed complications of any surgery, complications in future surgery reviewed. Also discussed neonatal complications incl difficult delivery, laceration, vacuum assistance at c-section, TTN etc. Pt understands and agrees, all concerns addressed.

## 2011-04-03 NOTE — Addendum Note (Signed)
Addendum  created 04/03/11 2011 by Karleen Dolphin, CRNA   Modules edited:Notes Section

## 2011-04-03 NOTE — Consult Note (Signed)
Requested to attend primary C/S for failure of descent at term gestation with maternal h/o + GBBS that has been prophylaxed for more than 4 hours. Had ruptured at ~ 12 noon yesterday.  At delivery infant's head was well engaged in birth canal and was decompressed with some difficulty from below.  With extraction of infant manually he was limp and apneic when placed under radiant warmer. Naso/oropharynx was cleared with a bulb syringe; heart rate was auscultated as >>100.  With apnea and low tone, not responsive to vigorous tactile stimulation with drying, IPPV with bag/mask was initiated with air exchange auscultated bilaterally and chest wall rise visually. HR remained > 100 .  Muscle tone rapidly improved by one minute of age as did central color. IPPV discontinued by one minute of age and BBO2 continued for another minute to face. By five minutes there had been full recovery of tone and spontaneous cries with good tidal volume breaths.   There are no dysmorphic features although there is a presacral dimple that is NOT midline and does not appear to have a sinus tract. Consideration of tethered cord or occult spina bifida should be made.   Care of infant transferred to RN and to pediatrician of record.   Dagoberto Ligas MD [facility]

## 2011-04-03 NOTE — OR Nursing (Signed)
Uterus massaged by S. Shantal Roan Charity fundraiser. Two tubes of cord blood to lab. Foley in upon arrival to OR. Urine color-bloody.

## 2011-04-04 ENCOUNTER — Encounter (HOSPITAL_COMMUNITY): Payer: Self-pay

## 2011-04-04 LAB — CBC
HCT: 22.8 % — ABNORMAL LOW (ref 36.0–46.0)
Hemoglobin: 7.1 g/dL — ABNORMAL LOW (ref 12.0–15.0)
MCV: 76.8 fL — ABNORMAL LOW (ref 78.0–100.0)
WBC: 14.6 10*3/uL — ABNORMAL HIGH (ref 4.0–10.5)

## 2011-04-04 LAB — COMPREHENSIVE METABOLIC PANEL
Albumin: 1.9 g/dL — ABNORMAL LOW (ref 3.5–5.2)
Alkaline Phosphatase: 187 U/L — ABNORMAL HIGH (ref 39–117)
BUN: 7 mg/dL (ref 6–23)
CO2: 26 mEq/L (ref 19–32)
Chloride: 103 mEq/L (ref 96–112)
Creatinine, Ser: 0.7 mg/dL (ref 0.50–1.10)
GFR calc Af Amer: >90 mL/min (ref >90)
GFR calc non Af Amer: >90 mL/min (ref >90)
Glucose, Bld: 105 mg/dL — ABNORMAL HIGH (ref 70–99)
Potassium: 3.9 mEq/L (ref 3.5–5.1)
Total Bilirubin: 0.2 mg/dL — ABNORMAL LOW (ref 0.3–1.2)

## 2011-04-04 MED ORDER — RHO D IMMUNE GLOBULIN 1500 UNIT/2ML IJ SOLN
300.0000 ug | Freq: Once | INTRAMUSCULAR | Status: AC
  Administered 2011-04-04: 300 ug via INTRAVENOUS
  Filled 2011-04-04: qty 2

## 2011-04-04 NOTE — Progress Notes (Addendum)
Subjective: POD# 1 Information for the patient's newborn:  Chelsea Woods, Chelsea Woods [[id number]]  female   Reports feeling tired, episode of dizziness with ambulation this AM. Feeding: breast Patient reports tolerating PO.  Breast symptoms: none Pain controlled with Motrin, minimal Percocet use Denies HA/SOB/C/P/N/V. Flatus absent. She reports vaginal bleeding as normal, without clots. Ambulated to BR once, foley cath in place, clear urine. Objective:   VS:  Filed Vitals:   04/04/11 0433 04/04/11 0435 04/04/11 0500 04/04/11 0800  BP: 96/62 83/54 98/62  97/58  Pulse: 122 133 116 102  Temp:      TempSrc:      Resp:   20 20  Height:      Weight:      SpO2: 98% 95% 100% 100%     Intake/Output Summary (Last 24 hours) at 04/04/11 0955 Last data filed at 04/04/11 0430  Gross per 24 hour  Intake   2860 ml  Output   4050 ml  Net  -1190 ml        Basename 04/04/11 0517 04/02/11 0745  WBC 14.6* 9.0  HGB 7.1* 9.9*  HCT 22.8* 33.0*  PLT 240 356   Lab Results  Component Value Date   ALT 57* 04/04/2011   AST 59* 04/04/2011   ALKPHOS 187* 04/04/2011   BILITOT 0.2* 04/04/2011     Blood type: --/--/B NEG (02/03 0527) / Rhogam work up in progress  Rubella: Immune (02/01 0909)     Physical Exam:  General: alert, cooperative and no distress CV: Regular rate and rhythm , mild tachy Resp: clear Abdomen: soft, nontender, decreased bowel sounds, (+) gas distention Incision: dressing intact Uterine Fundus: firm, below umbilicus, appropriately tender Lochia: minimal Ext: Homans sign is negative, no sign of DVT and no edema, redness or tenderness in the calves or thighs      Assessment/Plan: 26 y.o.  status post Cesarean section. POD# 1.  s/p Cesarean Delivery.  Indications: cephalopelvic disproportion                 Doing well, stable.   Advance diet as tolerated  D/C foley   Ambulate, warm PO fluids  Routine post-op care  Acute blood loss anemia superimposed on chronic maternal  anemia  Mildly symptomatic elevated HR w/ activity, good urinary output  Continue I&O  Continue IV fluids  Rpt CBC in AM   Will start oral Fe when flatus present   *Intrahepatic cholestasis of pregnancy  Itching resolved, LFT's stable / improving  Low fat diet                 Chelsea Woods 04/04/2011, 9:55 AM

## 2011-04-05 ENCOUNTER — Encounter (HOSPITAL_COMMUNITY): Payer: Self-pay | Admitting: Obstetrics & Gynecology

## 2011-04-05 LAB — CBC
MCH: 23.5 pg — ABNORMAL LOW (ref 26.0–34.0)
Platelets: 252 10*3/uL (ref 150–400)
RBC: 2.81 MIL/uL — ABNORMAL LOW (ref 3.87–5.11)

## 2011-04-05 LAB — RH IG WORKUP (INCLUDES ABO/RH)
Fetal Screen: NEGATIVE
Unit division: 0

## 2011-04-05 LAB — PREPARE RBC (CROSSMATCH)

## 2011-04-05 MED ORDER — DIPHENHYDRAMINE HCL 50 MG/ML IJ SOLN
25.0000 mg | Freq: Once | INTRAMUSCULAR | Status: AC
  Administered 2011-04-05: 25 mg via INTRAVENOUS
  Filled 2011-04-05: qty 1

## 2011-04-05 MED ORDER — ACETAMINOPHEN 500 MG PO TABS
1000.0000 mg | ORAL_TABLET | Freq: Once | ORAL | Status: AC
  Administered 2011-04-05: 1000 mg via ORAL
  Filled 2011-04-05: qty 2

## 2011-04-05 NOTE — Progress Notes (Signed)
Subjective: POD# 2 Information for the patient's newborn:  Chelsea, Woods [[id number]]  female  / circ declined  Reports feeling tired, (+) pain at incision site. Walked in room yesterday, no syncope, (+) mild dizziness. Feeding: breast and bottle Patient reports tolerating PO.  Breast symptoms: none Pain controlled with Motrin, takes Percocet occasionally. Denies HA/SOB/C/P/N/V. Flatus present. She reports vaginal bleeding as normal, without clots.  She is ambulating, urinating without difficult.    Rhogam given.  Objective:   VS:  Filed Vitals:   04/04/11 0800 04/04/11 1200 04/04/11 2056 04/05/11 0535  BP: 97/58 107/71 97/58 106/75  Pulse: 102 108 109 99  Temp: 98.3 F (36.8 C) 98.4 F (36.9 C) 98.6 F (37 C) 97.7 F (36.5 C)  TempSrc: Oral Oral Oral Oral  Resp: 20 18 18 20   Height:      Weight:      SpO2: 100% 97%       Intake/Output Summary (Last 24 hours) at 04/05/11 0938 Last data filed at 04/04/11 1544  Gross per 24 hour  Intake    865 ml  Output   1800 ml  Net   -935 ml        Basename 04/05/11 0838 04/04/11 0517  WBC 10.8* 14.6*  HGB 6.6* 7.1*  HCT 22.1* 22.8*  PLT 252 240     Blood type: --/--/B NEG (02/03 0527) / Rhogam administered  Rubella: Immune (02/01 0909)     Physical Exam:  General: alert, cooperative and no distress , pale CV: Regular rate and rhythm, mild tachy with activity Resp: clear Abdomen: soft, nontender, normal bowel sounds Incision: clean, dry, intact and mild edema around incision site Uterine Fundus: firm, below umbilicus, appropriately tender. Lochia: minimal Ext: edema trace and Homans sign is negative, no sign of DVT      Assessment/Plan: 26 y.o.  status post Cesarean section. POD# 2.  s/p Cesarean Delivery.  Indications: cephalopelvic disproportion                Principal Problem:  *PP care - s/p 1C/S 2/2  Active Problems:  Intrahepatic cholestasis of pregnancy  Stable labs, itching resolved  Acute blood  loss anemia over chronic iron deficiency anemia  Hgb nadir 6.6, no evidence of hemorrhage. Symptomatic - tired, dizzy with exertion.   Recommend PRBC 2 units transfusion, risk of transfusion (reaction, blood born pathogen transmission) reviewed vs risk of severe anemia (poor healing, fatigue, possible syncope, increased risk of post-partum depression). Patient and spouse consent to transfusion. Plan discussed w/ Dr. Juliene Pina - agrees.              PAUL,DANIELA 04/05/2011, 9:38 AM

## 2011-04-05 NOTE — Progress Notes (Signed)
UR chart review completed.  

## 2011-04-06 LAB — CBC
Platelets: 286 10*3/uL (ref 150–400)
RBC: 3.84 MIL/uL — ABNORMAL LOW (ref 3.87–5.11)
RDW: 19.6 % — ABNORMAL HIGH (ref 11.5–15.5)
WBC: 10.2 10*3/uL (ref 4.0–10.5)

## 2011-04-06 LAB — TYPE AND SCREEN
ABO/RH(D): B NEG
Unit division: 0
Unit division: 0

## 2011-04-06 LAB — BILE ACIDS, TOTAL: Bile Acids Total: 46 umol/L — ABNORMAL HIGH (ref 0–19)

## 2011-04-06 MED ORDER — POLYSACCHARIDE IRON 150 MG PO CAPS
150.0000 mg | ORAL_CAPSULE | Freq: Every day | ORAL | Status: DC
  Administered 2011-04-06: 150 mg via ORAL
  Filled 2011-04-06 (×3): qty 1

## 2011-04-06 MED ORDER — DOCUSATE SODIUM 100 MG PO CAPS
100.0000 mg | ORAL_CAPSULE | Freq: Every day | ORAL | Status: DC
  Administered 2011-04-06: 100 mg via ORAL
  Filled 2011-04-06 (×2): qty 1

## 2011-04-06 NOTE — Progress Notes (Addendum)
Patient ID: Chelsea Woods, female   DOB: 28-May-1985, 28 y.o.   MRN: [id number]  POSTOPERATIVE DAY # 3 S/P cesarean section   S:         Reports feeling tired / moderate pain despite analgesia             Tolerating po intake / no nausea / no vomiting / + flatus / + BM yesterday             Bleeding is light             Pain controlled with motrin and percocet             Up ad lib / ambulatory to bathroom only - no hall ambulation / "too tired" / no dizziness  Newborn breast feeding  / newborn under bili lights - no discharge planned today   O:  A & O x 3 NAD / resting on couch              VS: Blood pressure 112/68, pulse 89, temperature 98 F (36.7 C), temperature source Oral, resp. rate 20, height 5\' 4"  (1.626 m), weight 74.39 kg (164 lb), SpO2 100.00%, unknown if currently breastfeeding.  LABS: WBC/Hgb/Hct/Plts:  10.2/9.7/30.7/286 (02/05 0505)   Lungs: Clear and unlabored  Heart: regular rate and rhythm / no mumurs  Abdomen: soft, non-tender, non-distended, active BS             Fundus: firm, non-tender, U-1             Dressing OFF              Incision:  approximated with staples / no erythema / no ecchymosis / no drainage  Perineum: no edema  Lochia: light  Extremities: 1+ dependent edema, no calf pain or tenderness, negative Homans  A:        POD # 3 S/P cesarean section            Acute blood loss anemia s/p transfusion last evening - CBC stable            Cholestasis of pregnancy  P:        Routine postoperative care              Start iron and colace - repeat CBC in am             D/C IV today             Ambulate - assess orthostatic stability prior to discharge home             Anticipate discharge tomorrow            Continue Actigal - check CMP tomorrow / bile acids and CMP in 1 week     Sheleen Conchas 04/06/2011, 10:04 AM

## 2011-04-07 ENCOUNTER — Encounter (HOSPITAL_COMMUNITY): Payer: Self-pay

## 2011-04-07 DIAGNOSIS — O9902 Anemia complicating childbirth: Secondary | ICD-10-CM

## 2011-04-07 HISTORY — DX: Anemia complicating childbirth: O99.02

## 2011-04-07 LAB — COMPREHENSIVE METABOLIC PANEL
ALT: 37 U/L — ABNORMAL HIGH (ref 0–35)
AST: 31 U/L (ref 0–37)
Albumin: 2.1 g/dL — ABNORMAL LOW (ref 3.5–5.2)
Alkaline Phosphatase: 166 U/L — ABNORMAL HIGH (ref 39–117)
BUN: 10 mg/dL (ref 6–23)
CO2: 24 mEq/L (ref 19–32)
Calcium: 8.8 mg/dL (ref 8.4–10.5)
Chloride: 103 mEq/L (ref 96–112)
Creatinine, Ser: 0.6 mg/dL (ref 0.50–1.10)
GFR calc Af Amer: >90 mL/min (ref >90)
GFR calc non Af Amer: >90 mL/min (ref >90)
Glucose, Bld: 86 mg/dL (ref 70–99)
Potassium: 3.9 mEq/L (ref 3.5–5.1)
Sodium: 137 mEq/L (ref 135–145)
Total Bilirubin: 0.1 mg/dL — ABNORMAL LOW (ref 0.3–1.2)
Total Protein: 6.3 g/dL (ref 6.0–8.3)

## 2011-04-07 LAB — CBC
HCT: 31.4 % — ABNORMAL LOW (ref 36.0–46.0)
Hemoglobin: 10.2 g/dL — ABNORMAL LOW (ref 12.0–15.0)
MCH: 25.8 pg — ABNORMAL LOW (ref 26.0–34.0)
MCHC: 32.5 g/dL (ref 30.0–36.0)
MCV: 79.5 fL (ref 78.0–100.0)
Platelets: 314 10*3/uL (ref 150–400)
RBC: 3.95 MIL/uL (ref 3.87–5.11)
RDW: 19.9 % — ABNORMAL HIGH (ref 11.5–15.5)
WBC: 8.7 10*3/uL (ref 4.0–10.5)

## 2011-04-07 MED ORDER — URSODIOL 300 MG PO CAPS: 300.0000 mg | ORAL_CAPSULE | Freq: Two times a day (BID) | ORAL | Status: DC

## 2011-04-07 MED ORDER — OXYCODONE-ACETAMINOPHEN 5-325 MG PO TABS: 1.0000 | ORAL_TABLET | ORAL | Status: AC | PRN

## 2011-04-07 MED ORDER — IBUPROFEN 600 MG PO TABS: 600.0000 mg | ORAL_TABLET | Freq: Four times a day (QID) | ORAL | Status: AC

## 2011-04-07 MED ORDER — FERRALET 90 90-1 MG PO TABS: 1.0000 | ORAL_TABLET | Freq: Every day | ORAL | Status: DC

## 2011-04-07 MED ORDER — DSS 100 MG PO CAPS: 100.0000 mg | ORAL_CAPSULE | Freq: Every day | ORAL | Status: AC

## 2011-04-07 NOTE — Progress Notes (Signed)
Subjective: POD# 4 Information for the patient's newborn:  Ahja, Martello [[id number]]  female no circ planned  Reports feeling better, pain at incision site but controlled w/ PO meds. Limited ambulation in room, tried hallway yesterday and had one episode emesis.  Feeding: breast and bottle Patient reports tolerating PO.  Breast symptoms: milk not in yet. Denies HA/SOB/C/P/N/V/dizziness. Flatus present. (+) BM She reports vaginal bleeding as normal, without clots.  She is ambulating, urinating without difficult.     Objective:   VS:  Filed Vitals:   04/06/11 1338 04/06/11 1525 04/06/11 2140 04/07/11 0525  BP: 111/76 119/77 130/85 130/84  Pulse: 88 77 96 89  Temp: 98 F (36.7 C) 98.4 F (36.9 C) 98.7 F (37.1 C) 97.9 F (36.6 C)  TempSrc: Oral Oral Oral Oral  Resp: 18 18 18 18   Height:      Weight:      SpO2:        No intake or output data in the 24 hours ending 04/07/11 0954      Basename 04/07/11 0520 04/06/11 0505  WBC 8.7 10.2  HGB 10.2* 9.7*  HCT 31.4* 30.7*  PLT 314 286     Blood type: --/--/B NEG (02/04 1405) / Rhogam rcvd.  Rubella: Immune (02/01 0909)     Physical Exam:  General: alert, cooperative and no distress CV: Regular rate and rhythm Resp: clear Abdomen: soft, nontender, normal bowel sounds Incision: clean, dry, intact and staples in place Uterine Fundus: firm, below umbilicus, nontender Lochia: minimal Ext: Homans sign is negative, no sign of DVT and no edema, redness or tenderness in the calves or thighs      Assessment/Plan: 26 y.o.  status post Cesarean section. POD# 4.  s/p Cesarean Delivery.  Indications: failure to progress                Principal Problem:  *PP care - s/p 1C/S 2/2 Active Problems:  Intrahepatic cholestasis of pregnancy  CMP stable, LFT's improving  Continue Actigal - bile acids and CMP in 1 week   Maternal anemia, with delivery  Acute blood loss anemia s/p transfusion   Rpt CBC stable  Continue oral Fe 4-6  wks    D/C home w/ WOB instructions booklet. Lactation support resources dicussed.    PAUL,DANIELA 04/07/2011, 9:54 AM

## 2011-04-12 NOTE — Discharge Summary (Signed)
Obstetric Discharge Summary Reason for Admission: - 37.1/7 wks labor induction for Intrahepatic cholestasis, anemia Prenatal Procedures: - Routine labs and sono normal. Bile acids at 36+ wks elevated, LFT abnormal. IOL planned at 37 wks with 2x/wk antenatal testing.  Intrapartum Procedures: - Cytotec IOL followed by pitocin, Epidural. C/section for arrest of descent.  Postpartum Procedures: - Blood transfusion, 2 units pRBCs for symptomatic anemia. Rhogam.  Complications-Operative and Postpartum: - No surgical complications but worsening of anemia with symptoms requiring blood transfusion.   Hemoglobin  Date Value Range Status  04/07/2011 10.2* 12.0-15.0 (g/dL) Final     HCT  Date Value Range Status  04/07/2011 31.4* 36.0-46.0 (%) Final    Discharge Diagnoses: S/p Primary Low transverse cesarean section. Symptomatic anemia with blood transfusion. LFT improved, itching from cholestasis resolved.   Discharge Information: Improved status, pain well controlled. Baby BOY, doing well (circumcission declined) Date: 04/07/2011 Activity: pelvic rest Diet: routine Medications: PNV, Ibuprofen, Colace, Iron and Percocet, Actigal for 1 wk and recheck labs in office.  Condition: stable Instructions: refer to practice specific booklet Discharge to: home Follow-up Information    Follow up with Kriya Westra R, MD. Schedule an appointment as soon as possible for a visit in 1 week. (Repeat labs - bile salts and liver function tests)    Contact information:   [address] [place] [address] [phone]         Newborn Data: Live born female  Birth Weight: 6 lb 10.9 oz (3030 g) APGAR: 4, 9. Normal cord gas (see op noted) Home with mother.  Elaine Roanhorse R 04/12/2011, 7:29 AM

## 2011-04-22 ENCOUNTER — Inpatient Hospital Stay (HOSPITAL_COMMUNITY): Admission: AD | Admit: 2011-04-22 | Payer: Self-pay | Source: Ambulatory Visit | Admitting: Obstetrics & Gynecology

## 2012-03-06 ENCOUNTER — Other Ambulatory Visit: Payer: Self-pay | Admitting: Obstetrics & Gynecology

## 2012-03-07 ENCOUNTER — Other Ambulatory Visit: Payer: Self-pay | Admitting: Obstetrics & Gynecology

## 2012-03-08 ENCOUNTER — Encounter (HOSPITAL_COMMUNITY): Payer: Self-pay | Admitting: *Deleted

## 2012-03-16 ENCOUNTER — Encounter (HOSPITAL_COMMUNITY): Admission: RE | Payer: Self-pay | Source: Ambulatory Visit

## 2012-03-16 ENCOUNTER — Ambulatory Visit (HOSPITAL_COMMUNITY): Admission: RE | Admit: 2012-03-16 | Payer: 59 | Source: Ambulatory Visit | Admitting: Obstetrics & Gynecology

## 2012-03-16 HISTORY — DX: Hypothyroidism, unspecified: E03.9

## 2012-03-16 SURGERY — DILATATION & CURETTAGE/HYSTEROSCOPY WITH RESECTOCOPE
Anesthesia: Choice

## 2012-03-28 ENCOUNTER — Encounter (HOSPITAL_COMMUNITY): Payer: Self-pay | Admitting: Pharmacist

## 2012-03-29 ENCOUNTER — Encounter (HOSPITAL_COMMUNITY): Payer: Self-pay | Admitting: *Deleted

## 2012-04-05 ENCOUNTER — Encounter (HOSPITAL_COMMUNITY): Admission: RE | Payer: Self-pay | Source: Ambulatory Visit

## 2012-04-05 ENCOUNTER — Ambulatory Visit (HOSPITAL_COMMUNITY): Admission: RE | Admit: 2012-04-05 | Payer: 59 | Source: Ambulatory Visit | Admitting: Obstetrics & Gynecology

## 2012-04-05 SURGERY — DILATATION & CURETTAGE/HYSTEROSCOPY WITH RESECTOCOPE
Anesthesia: Choice

## 2013-12-31 ENCOUNTER — Encounter (HOSPITAL_COMMUNITY): Payer: Self-pay | Admitting: *Deleted

## 2015-08-26 LAB — OB RESULTS CONSOLE RUBELLA ANTIBODY, IGM: RUBELLA: IMMUNE

## 2015-08-26 LAB — OB RESULTS CONSOLE ABO/RH: RH Type: NEGATIVE

## 2015-08-26 LAB — OB RESULTS CONSOLE GC/CHLAMYDIA
CHLAMYDIA, DNA PROBE: NEGATIVE
Gonorrhea: NEGATIVE

## 2015-08-26 LAB — OB RESULTS CONSOLE RPR: RPR: NONREACTIVE

## 2015-08-26 LAB — OB RESULTS CONSOLE HIV ANTIBODY (ROUTINE TESTING): HIV: NONREACTIVE

## 2015-08-26 LAB — OB RESULTS CONSOLE ANTIBODY SCREEN: Antibody Screen: NEGATIVE

## 2015-08-26 LAB — OB RESULTS CONSOLE HEPATITIS B SURFACE ANTIGEN: HEP B S AG: NEGATIVE

## 2015-12-02 ENCOUNTER — Other Ambulatory Visit (HOSPITAL_COMMUNITY): Payer: Self-pay | Admitting: Obstetrics & Gynecology

## 2015-12-02 DIAGNOSIS — Z3A25 25 weeks gestation of pregnancy: Secondary | ICD-10-CM

## 2015-12-02 DIAGNOSIS — O283 Abnormal ultrasonic finding on antenatal screening of mother: Secondary | ICD-10-CM

## 2015-12-02 DIAGNOSIS — Z3689 Encounter for other specified antenatal screening: Secondary | ICD-10-CM

## 2015-12-04 ENCOUNTER — Encounter (HOSPITAL_COMMUNITY): Payer: Self-pay | Admitting: Obstetrics & Gynecology

## 2015-12-09 ENCOUNTER — Encounter (HOSPITAL_COMMUNITY): Payer: Self-pay | Admitting: *Deleted

## 2015-12-10 ENCOUNTER — Ambulatory Visit (HOSPITAL_COMMUNITY)
Admission: RE | Admit: 2015-12-10 | Discharge: 2015-12-10 | Disposition: A | Discharge status: Home / Self Care | Payer: Self-pay | Source: Ambulatory Visit | Attending: Obstetrics & Gynecology | Admitting: Obstetrics & Gynecology

## 2015-12-10 ENCOUNTER — Encounter (HOSPITAL_COMMUNITY): Payer: Self-pay

## 2015-12-10 DIAGNOSIS — Z3A24 24 weeks gestation of pregnancy: Secondary | ICD-10-CM | POA: Insufficient documentation

## 2015-12-10 DIAGNOSIS — O99282 Endocrine, nutritional and metabolic diseases complicating pregnancy, second trimester: Secondary | ICD-10-CM | POA: Insufficient documentation

## 2015-12-10 DIAGNOSIS — Z363 Encounter for antenatal screening for malformations: Secondary | ICD-10-CM | POA: Insufficient documentation

## 2015-12-10 DIAGNOSIS — O283 Abnormal ultrasonic finding on antenatal screening of mother: Secondary | ICD-10-CM

## 2015-12-10 DIAGNOSIS — O26842 Uterine size-date discrepancy, second trimester: Secondary | ICD-10-CM | POA: Insufficient documentation

## 2015-12-10 DIAGNOSIS — Z3689 Encounter for other specified antenatal screening: Secondary | ICD-10-CM

## 2015-12-10 DIAGNOSIS — Z6791 Unspecified blood type, Rh negative: Secondary | ICD-10-CM | POA: Insufficient documentation

## 2015-12-10 DIAGNOSIS — E039 Hypothyroidism, unspecified: Secondary | ICD-10-CM | POA: Insufficient documentation

## 2015-12-10 DIAGNOSIS — O26892 Other specified pregnancy related conditions, second trimester: Secondary | ICD-10-CM | POA: Insufficient documentation

## 2015-12-10 DIAGNOSIS — O34219 Maternal care for unspecified type scar from previous cesarean delivery: Secondary | ICD-10-CM | POA: Insufficient documentation

## 2015-12-10 DIAGNOSIS — Z3A25 25 weeks gestation of pregnancy: Secondary | ICD-10-CM

## 2015-12-11 ENCOUNTER — Encounter (HOSPITAL_COMMUNITY): Payer: Self-pay

## 2016-01-01 DIAGNOSIS — Z23 Encounter for immunization: Secondary | ICD-10-CM | POA: Diagnosis not present

## 2016-01-01 DIAGNOSIS — O360110 Maternal care for anti-D [Rh] antibodies, first trimester, not applicable or unspecified: Secondary | ICD-10-CM | POA: Diagnosis not present

## 2016-01-01 DIAGNOSIS — Z3483 Encounter for supervision of other normal pregnancy, third trimester: Secondary | ICD-10-CM | POA: Diagnosis not present

## 2016-01-01 DIAGNOSIS — Z3689 Encounter for other specified antenatal screening: Secondary | ICD-10-CM | POA: Diagnosis not present

## 2016-03-01 HISTORY — DX: Maternal care for unspecified type scar from previous cesarean delivery: O34.219

## 2016-03-01 NOTE — L&D Delivery Note (Signed)
Delivery Note Pudendal block with 20 cc 1% Lidocaine given. Left mediolateral episiotomy given due to tight perineal body. At 2:57 AM a viable and healthy female was delivered via  (Presentation: OA ) successful VBAC.  APGAR: 9 and 9; weight- pending  .   Placenta status: spontaneous and complete .  Cord:  with the following complications: None  Cord pH: N/A  Anesthesia:  Pudendal block Episiotomy: Left mediolateral, no extensions  Lacerations:  None  Suture Repair: 2.0 vicryl rapide Est. Blood Loss (mL):  350 cc  Mom to postpartum.  Baby to Couplet care / Skin to Skin.  Chelsea Woods R 03/21/2016, 3:35 AM

## 2016-03-05 DIAGNOSIS — O365930 Maternal care for other known or suspected poor fetal growth, third trimester, not applicable or unspecified: Secondary | ICD-10-CM | POA: Diagnosis not present

## 2016-03-05 DIAGNOSIS — Z3A37 37 weeks gestation of pregnancy: Secondary | ICD-10-CM | POA: Diagnosis not present

## 2016-03-21 ENCOUNTER — Encounter (HOSPITAL_COMMUNITY): Payer: Self-pay

## 2016-03-21 ENCOUNTER — Inpatient Hospital Stay (HOSPITAL_COMMUNITY)
Admission: AD | Admit: 2016-03-21 | Discharge: 2016-03-22 | DRG: 775 | Disposition: A | Discharge status: Home / Self Care | Payer: BLUE CROSS/BLUE SHIELD | Source: Ambulatory Visit | Attending: Obstetrics & Gynecology | Admitting: Obstetrics & Gynecology

## 2016-03-21 DIAGNOSIS — O26893 Other specified pregnancy related conditions, third trimester: Principal | ICD-10-CM | POA: Diagnosis present

## 2016-03-21 DIAGNOSIS — K219 Gastro-esophageal reflux disease without esophagitis: Secondary | ICD-10-CM | POA: Diagnosis not present

## 2016-03-21 DIAGNOSIS — O26899 Other specified pregnancy related conditions, unspecified trimester: Secondary | ICD-10-CM

## 2016-03-21 DIAGNOSIS — Z3493 Encounter for supervision of normal pregnancy, unspecified, third trimester: Secondary | ICD-10-CM | POA: Diagnosis present

## 2016-03-21 DIAGNOSIS — O34211 Maternal care for low transverse scar from previous cesarean delivery: Secondary | ICD-10-CM | POA: Diagnosis present

## 2016-03-21 DIAGNOSIS — Z3A40 40 weeks gestation of pregnancy: Secondary | ICD-10-CM

## 2016-03-21 DIAGNOSIS — O99824 Streptococcus B carrier state complicating childbirth: Secondary | ICD-10-CM | POA: Diagnosis present

## 2016-03-21 DIAGNOSIS — Z6791 Unspecified blood type, Rh negative: Secondary | ICD-10-CM | POA: Diagnosis not present

## 2016-03-21 DIAGNOSIS — O34219 Maternal care for unspecified type scar from previous cesarean delivery: Secondary | ICD-10-CM | POA: Diagnosis present

## 2016-03-21 DIAGNOSIS — Z23 Encounter for immunization: Secondary | ICD-10-CM | POA: Diagnosis not present

## 2016-03-21 DIAGNOSIS — O9962 Diseases of the digestive system complicating childbirth: Secondary | ICD-10-CM | POA: Diagnosis not present

## 2016-03-21 DIAGNOSIS — Z3A39 39 weeks gestation of pregnancy: Secondary | ICD-10-CM | POA: Diagnosis not present

## 2016-03-21 LAB — RPR: RPR Ser Ql: NONREACTIVE

## 2016-03-21 LAB — CBC
HCT: 34.9 % — ABNORMAL LOW (ref 36.0–46.0)
HEMOGLOBIN: 11.8 g/dL — AB (ref 12.0–15.0)
MCH: 28.4 pg (ref 26.0–34.0)
MCHC: 33.8 g/dL (ref 30.0–36.0)
MCV: 83.9 fL (ref 78.0–100.0)
Platelets: 201 10*3/uL (ref 150–400)
RBC: 4.16 MIL/uL (ref 3.87–5.11)
RDW: 16.5 % — ABNORMAL HIGH (ref 11.5–15.5)
WBC: 9.6 10*3/uL (ref 4.0–10.5)

## 2016-03-21 LAB — TYPE AND SCREEN
ABO/RH(D): B NEG
Antibody Screen: NEGATIVE

## 2016-03-21 MED ORDER — ONDANSETRON HCL 4 MG PO TABS: 4.0000 mg | ORAL_TABLET | ORAL | Status: DC | PRN

## 2016-03-21 MED ORDER — DIBUCAINE 1 % RE OINT: 1.0000 "application " | TOPICAL_OINTMENT | RECTAL | Status: DC | PRN

## 2016-03-21 MED ORDER — IBUPROFEN 600 MG PO TABS
600.0000 mg | ORAL_TABLET | Freq: Four times a day (QID) | ORAL | Status: DC
  Filled 2016-03-21: qty 1

## 2016-03-21 MED ORDER — DIPHENHYDRAMINE HCL 50 MG/ML IJ SOLN: 12.5000 mg | INTRAMUSCULAR | Status: DC | PRN

## 2016-03-21 MED ORDER — FENTANYL 2.5 MCG/ML BUPIVACAINE 1/10 % EPIDURAL INFUSION (WH - ANES)
14.0000 mL/h | INTRAMUSCULAR | Status: DC | PRN
  Filled 2016-03-21: qty 100

## 2016-03-21 MED ORDER — ONDANSETRON HCL 4 MG/2ML IJ SOLN: 4.0000 mg | Freq: Four times a day (QID) | INTRAMUSCULAR | Status: DC | PRN

## 2016-03-21 MED ORDER — ONDANSETRON HCL 4 MG/2ML IJ SOLN: 4.0000 mg | INTRAMUSCULAR | Status: DC | PRN

## 2016-03-21 MED ORDER — LACTATED RINGERS IV SOLN: 500.0000 mL | Freq: Once | INTRAVENOUS | Status: DC

## 2016-03-21 MED ORDER — OXYCODONE-ACETAMINOPHEN 5-325 MG PO TABS
1.0000 | ORAL_TABLET | ORAL | Status: DC | PRN
  Administered 2016-03-21: 1 via ORAL
  Filled 2016-03-21: qty 1

## 2016-03-21 MED ORDER — PHENYLEPHRINE 40 MCG/ML (10ML) SYRINGE FOR IV PUSH (FOR BLOOD PRESSURE SUPPORT)
80.0000 ug | PREFILLED_SYRINGE | INTRAVENOUS | Status: DC | PRN
  Filled 2016-03-21: qty 5

## 2016-03-21 MED ORDER — WITCH HAZEL-GLYCERIN EX PADS: 1.0000 "application " | MEDICATED_PAD | CUTANEOUS | Status: DC | PRN

## 2016-03-21 MED ORDER — DIPHENHYDRAMINE HCL 25 MG PO CAPS: 25.0000 mg | ORAL_CAPSULE | Freq: Four times a day (QID) | ORAL | Status: DC | PRN

## 2016-03-21 MED ORDER — PHENYLEPHRINE 40 MCG/ML (10ML) SYRINGE FOR IV PUSH (FOR BLOOD PRESSURE SUPPORT)
80.0000 ug | PREFILLED_SYRINGE | INTRAVENOUS | Status: DC | PRN
  Filled 2016-03-21: qty 5
  Filled 2016-03-21: qty 10

## 2016-03-21 MED ORDER — PRENATAL MULTIVITAMIN CH
1.0000 | ORAL_TABLET | Freq: Every day | ORAL | Status: DC
  Administered 2016-03-21: 1 via ORAL
  Filled 2016-03-21 (×2): qty 1

## 2016-03-21 MED ORDER — TETANUS-DIPHTH-ACELL PERTUSSIS 5-2.5-18.5 LF-MCG/0.5 IM SUSP: 0.5000 mL | Freq: Once | INTRAMUSCULAR | Status: DC

## 2016-03-21 MED ORDER — EPHEDRINE 5 MG/ML INJ
10.0000 mg | INTRAVENOUS | Status: DC | PRN
  Filled 2016-03-21: qty 4

## 2016-03-21 MED ORDER — SIMETHICONE 80 MG PO CHEW: 80.0000 mg | CHEWABLE_TABLET | ORAL | Status: DC | PRN

## 2016-03-21 MED ORDER — LACTATED RINGERS IV SOLN: 500.0000 mL | INTRAVENOUS | Status: DC | PRN

## 2016-03-21 MED ORDER — SENNOSIDES-DOCUSATE SODIUM 8.6-50 MG PO TABS
2.0000 | ORAL_TABLET | ORAL | Status: DC
  Administered 2016-03-22: 2 via ORAL
  Filled 2016-03-21: qty 2

## 2016-03-21 MED ORDER — COCONUT OIL OIL
1.0000 "application " | TOPICAL_OIL | Status: DC | PRN
  Administered 2016-03-22: 1 via TOPICAL
  Filled 2016-03-21: qty 120

## 2016-03-21 MED ORDER — OXYTOCIN 40 UNITS IN LACTATED RINGERS INFUSION - SIMPLE MED
2.5000 [IU]/h | INTRAVENOUS | Status: DC
  Filled 2016-03-21: qty 1000

## 2016-03-21 MED ORDER — ZOLPIDEM TARTRATE 5 MG PO TABS: 5.0000 mg | ORAL_TABLET | Freq: Every evening | ORAL | Status: DC | PRN

## 2016-03-21 MED ORDER — LIDOCAINE HCL (PF) 1 % IJ SOLN
30.0000 mL | INTRAMUSCULAR | Status: DC | PRN
  Administered 2016-03-21: 30 mL via SUBCUTANEOUS
  Filled 2016-03-21 (×2): qty 30

## 2016-03-21 MED ORDER — ACETAMINOPHEN 325 MG PO TABS: 650.0000 mg | ORAL_TABLET | ORAL | Status: DC | PRN

## 2016-03-21 MED ORDER — DEXTROMETHORPHAN POLISTIREX ER 30 MG/5ML PO SUER
30.0000 mg | Freq: Two times a day (BID) | ORAL | Status: DC | PRN
  Administered 2016-03-21 – 2016-03-22 (×2): 30 mg via ORAL
  Filled 2016-03-21 (×4): qty 5

## 2016-03-21 MED ORDER — IBUPROFEN 600 MG PO TABS
600.0000 mg | ORAL_TABLET | Freq: Four times a day (QID) | ORAL | Status: DC
  Administered 2016-03-21 – 2016-03-22 (×6): 600 mg via ORAL
  Filled 2016-03-21 (×5): qty 1

## 2016-03-21 MED ORDER — OXYTOCIN BOLUS FROM INFUSION
500.0000 mL | Freq: Once | INTRAVENOUS | Status: DC
  Administered 2016-03-21: 500 mL via INTRAVENOUS

## 2016-03-21 MED ORDER — OXYCODONE-ACETAMINOPHEN 5-325 MG PO TABS: 2.0000 | ORAL_TABLET | ORAL | Status: DC | PRN

## 2016-03-21 MED ORDER — ACETAMINOPHEN 325 MG PO TABS
650.0000 mg | ORAL_TABLET | ORAL | Status: DC | PRN
  Administered 2016-03-21: 650 mg via ORAL
  Filled 2016-03-21: qty 2

## 2016-03-21 MED ORDER — PENICILLIN G POTASSIUM 5000000 UNITS IJ SOLR
5.0000 10*6.[IU] | Freq: Once | INTRAVENOUS | Status: DC
  Administered 2016-03-21: 5 10*6.[IU] via INTRAVENOUS
  Filled 2016-03-21: qty 5

## 2016-03-21 MED ORDER — PENICILLIN G POT IN DEXTROSE 60000 UNIT/ML IV SOLN
3.0000 10*6.[IU] | INTRAVENOUS | Status: DC
  Filled 2016-03-21 (×3): qty 50

## 2016-03-21 MED ORDER — BENZOCAINE-MENTHOL 20-0.5 % EX AERO
1.0000 "application " | INHALATION_SPRAY | CUTANEOUS | Status: DC | PRN
  Administered 2016-03-21 – 2016-03-22 (×2): 1 via TOPICAL
  Filled 2016-03-21 (×2): qty 56

## 2016-03-21 MED ORDER — LACTATED RINGERS IV SOLN: INTRAVENOUS | Status: DC

## 2016-03-21 MED ORDER — FLEET ENEMA 7-19 GM/118ML RE ENEM: 1.0000 | ENEMA | RECTAL | Status: DC | PRN

## 2016-03-21 MED ORDER — SOD CITRATE-CITRIC ACID 500-334 MG/5ML PO SOLN: 30.0000 mL | ORAL | Status: DC | PRN

## 2016-03-21 NOTE — Lactation Note (Signed)
This note was copied from a baby's chart. Lactation Consultation Note  Patient Name: Chelsea Woods Today's Date: 03/21/2016 Reason for consult: Initial assessment Baby at 10 hr of life. Mom reports baby is latching well. She denies breast or nipple pain, voiced no concerns. She bf her older child for 6279m with no complications. At 2179m she stated that her milk dried up and she was told that it was the medication she was on for birth control. She plans to bf longer with baby "because I will not take those medicines again". Discussed baby behavior, feeding frequency, baby belly size, voids, wt loss, breast changes, and nipple care. Demonstrated manual expression, colostrum noted bilaterally, spoon in room. Given lactation handouts. Aware of OP services and support group.      Maternal Data Has patient been taught Hand Expression?: Yes Does the patient have breastfeeding experience prior to this delivery?: Yes  Feeding Feeding Type: Breast Fed  LATCH Score/Interventions Latch: Repeated attempts needed to sustain latch, nipple held in mouth throughout feeding, stimulation needed to elicit sucking reflex. Intervention(s): Adjust position;Assist with latch  Audible Swallowing: A few with stimulation Intervention(s): Hand expression;Skin to skin Intervention(s): Alternate breast massage  Type of Nipple: Everted at rest and after stimulation  Comfort (Breast/Nipple): Soft / non-tender     Hold (Positioning): Full assist, staff holds infant at breast Intervention(s): Position options;Support Pillows  LATCH Score: 6  Lactation Tools Discussed/Used WIC Program: No   Consult Status Consult Status: Follow-up Date: 03/22/16 Follow-up type: In-patient    Chelsea Woods 03/21/2016, 1:08 PM

## 2016-03-21 NOTE — Progress Notes (Signed)
Dr Juliene PinaMody notified of pt's admission and status. Aware of sve of 7 and pt desires TOLAC. [facility]

## 2016-03-21 NOTE — Progress Notes (Addendum)
Called Dr Juliene PinaMody to request an order of delsum or robitussin plain (both ok with breastfeeding) for patient's dry cough. Left message and call back number at [phone]. Jtwells, rn Notified Dr Algie CofferFogelman (corrected physician on call) at 2015 and requested medication.  New orders. Jtwells, rn

## 2016-03-21 NOTE — H&P (Signed)
Chelsea KocherSweta Woods is a 31 y.o. female presenting for active labor at 40 wks. UCs since 7 pm and now stronger since last 2 hrs. Was 7 cm in triage and now complete and crowning.  Uncomplicated pregnancy. Rh negative. GERD.  GBS(+).  G1-  LTCS for FTP/ arrest of descent, Hx of Cholestasis of pregnancy   TAB   OB History    Gravida Para Term Preterm AB Living   3 1 1   1 1    SAB TAB Ectopic Multiple Live Births           1     Past Medical History:  Diagnosis Date  . Hypothyroidism   . Maternal anemia, with delivery 04/07/2011  . PP care - s/p 1C/S 2/2 04/04/2011   Past Surgical History:  Procedure Laterality Date  . CESAREAN SECTION  04/03/2011   Procedure: CESAREAN SECTION;  Surgeon: Robley FriesVaishali R Lillard Bailon, MD;  Location: [facility];  Service: Gynecology;  Laterality: N/A;  Primary cesarean section with delivery of baby boy at [id number]. Apgars 4/9.   Family History: family history is not on file. Social History:  reports that she has never smoked. She has never used smokeless tobacco. She reports that she does not drink alcohol or use drugs.     Maternal Diabetes: No Genetic Screening: Normal Maternal Ultrasounds/Referrals: Normal Fetal Ultrasounds or other Referrals:  None Maternal Substance Abuse:  No Significant Maternal Medications:  Meds include: Other: Lansoprazole Significant Maternal Lab Results:  Lab values include: Group B Strep positive, Rh negative Other Comments:  None  ROS History Dilation: 10 Effacement (%): 90 Station: -2 Exam by:: benji Forensic scientiststanley RN Blood pressure 120/75, pulse 86, temperature 98.4 F (36.9 C), temperature source Oral, resp. rate 18, height 5\' 4"  (1.626 m), weight 175 lb (79.4 kg), SpO2 100 %. Exam Physical Exam  BP 120/75   Pulse 86   Temp 98.4 F (36.9 C) (Oral)   Resp 18   Ht 5\' 4"  (1.626 m)   Wt 175 lb (79.4 kg)   SpO2 100%   BMI 30.04 kg/m  Physical exam:  A&O x 3, no acute distress. Pleasant HEENT neg, no thyromegaly Lungs CTA bilat CV RRR, S1S2  normal Abdo soft, non tender, non acute Extr no edema/ tenderness Pelvic above  FHT  140s/ cat I Toco q 2-3 min  Prenatal labs: ABO, Rh: --/--/B NEG (01/21 0205) Antibody: NEG (01/21 0205) Rubella: Immune (06/27 0000) RPR: Nonreactive (06/27 0000)  HBsAg: Negative (06/27 0000)  HIV: Non-reactive (06/27 0000)  GBS:   Positive   Assessment/Plan: 31 yo G3P1011, active labor, second stage, VBAC candidate. Proceed with delivery. Late for GBS prophylaxis and also epidural. Nitrous Oxide and Pudendal block reviewed, agrees. Anticipate VBAC,     Emidio Warrell R 03/21/2016, 3:28 AM

## 2016-03-21 NOTE — Plan of Care (Signed)
Problem: Urinary Elimination: Goal: Ability to reestablish a normal urinary elimination pattern will improve Outcome: Not Progressing Patient has not voided at this time (0800) since delivery ([phone]). Has been encouraged to void but has refused.

## 2016-03-21 NOTE — MAU Note (Signed)
Been having contractions since yesterday and got stronger.  No bleeding. No leaking.

## 2016-03-22 DIAGNOSIS — O26899 Other specified pregnancy related conditions, unspecified trimester: Secondary | ICD-10-CM

## 2016-03-22 DIAGNOSIS — Z6791 Unspecified blood type, Rh negative: Secondary | ICD-10-CM

## 2016-03-22 LAB — CBC
HCT: 31.7 % — ABNORMAL LOW (ref 36.0–46.0)
HEMOGLOBIN: 10.5 g/dL — AB (ref 12.0–15.0)
MCH: 28.2 pg (ref 26.0–34.0)
MCHC: 33.1 g/dL (ref 30.0–36.0)
MCV: 85.2 fL (ref 78.0–100.0)
Platelets: 172 10*3/uL (ref 150–400)
RBC: 3.72 MIL/uL — AB (ref 3.87–5.11)
RDW: 16.5 % — ABNORMAL HIGH (ref 11.5–15.5)
WBC: 10.8 10*3/uL — AB (ref 4.0–10.5)

## 2016-03-22 MED ORDER — RHO D IMMUNE GLOBULIN 1500 UNIT/2ML IJ SOSY
300.0000 ug | PREFILLED_SYRINGE | Freq: Once | INTRAMUSCULAR | Status: AC
  Administered 2016-03-22: 300 ug via INTRAVENOUS
  Filled 2016-03-22: qty 2

## 2016-03-22 MED ORDER — COMPLETENATE 29-1 MG PO CHEW
1.0000 | CHEWABLE_TABLET | Freq: Every day | ORAL | Status: DC
  Administered 2016-03-22: 1 via ORAL
  Filled 2016-03-22 (×2): qty 1

## 2016-03-22 MED ORDER — COCONUT OIL OIL: 1.0000 "application " | TOPICAL_OIL | 0 refills | Status: AC | PRN

## 2016-03-22 MED ORDER — BENZOCAINE-MENTHOL 20-0.5 % EX AERO: 1.0000 "application " | INHALATION_SPRAY | CUTANEOUS | Status: AC | PRN

## 2016-03-22 MED ORDER — PRENATAL MULTIVITAMIN CH: 1.0000 | ORAL_TABLET | Freq: Every day | ORAL | Status: AC

## 2016-03-22 MED ORDER — IBUPROFEN 600 MG PO TABS: 600.0000 mg | ORAL_TABLET | Freq: Four times a day (QID) | ORAL | 0 refills | Status: AC

## 2016-03-22 NOTE — Progress Notes (Signed)
UR chart review completed.  

## 2016-03-22 NOTE — Discharge Summary (Signed)
Obstetric Discharge Summary Reason for Admission: onset of labor Prenatal Procedures: ultrasound Intrapartum Procedures: spontaneous vaginal delivery and VBAC Postpartum Procedures: Rho(D) Ig Complications-Operative and Postpartum: mediolateral episiotomy Hemoglobin  Date Value Ref Range Status  03/22/2016 10.5 (L) 12.0 - 15.0 g/dL Final   HCT  Date Value Ref Range Status  03/22/2016 31.7 (L) 36.0 - 46.0 % Final    Physical Exam:  General: alert, cooperative and no distress Lochia: appropriate Uterine Fundus: firm Incision: healing well DVT Evaluation: No cords or calf tenderness. No significant calf/ankle edema.  Discharge Diagnoses: Term Pregnancy-delivered  Discharge Information: Date: 03/22/2016 Activity: pelvic rest Diet: routine Medications: PNV and Ibuprofen Condition: stable Instructions: refer to practice specific booklet Discharge to: home Follow-up Information    MODY,VAISHALI R, MD. Schedule an appointment as soon as possible for a visit in 6 week(s).   Specialty:  Obstetrics and Gynecology Contact information: [place] [place] [id number] [phone]           Newborn Data: Live born female  Birth Weight: 7 lb 10.8 oz (3481 g) APGAR: 9, 9  Home with mother.  Neta MendsDaniela C Naveyah Iacovelli, CNM 03/22/2016, 8:41 AM

## 2016-03-22 NOTE — Lactation Note (Addendum)
This note was copied from a baby's chart. Lactation Consultation Note Mom BF her son for 3 months. Had nipple pain as well.  Called into rm. D/t baby screaming, gassy, and will not latch but acts hungry. Has short shaft compressible nipples. Baby will not open very wide, cont. To "fish Lips" for latching. Chin tug done frequently then baby clamps down on nipple and bites again.  Fitted mom w/NS. Mom stated felt much better. Baby latched w/total assist. And BF well. Appeared satisfied. No swallows heard, noted softening of breast after feeding some. Mom very tired, needing encouragement to assist in holding breast and baby in good positioning. Encouraged cheek to breast. Educated on props, STS, breast massage, application of NS, and shells given to wear in am. FOB at bedside and attentive. Rm. Very warm, encouraged no blankets during feeding when rm, that warm.  Hand expressed colostrum to show mom.  Patient Name: Chelsea Tiffany KocherSweta Arrighi ZOXWR'UToday's Date: 03/22/2016 Reason for consult: Follow-up assessment;Difficult latch   Maternal Data    Feeding Feeding Type: Breast Fed Nipple Type: Slow - flow Length of feed: 20 min  LATCH Score/Interventions Latch: Repeated attempts needed to sustain latch, nipple held in mouth throughout feeding, stimulation needed to elicit sucking reflex. Intervention(s): Adjust position;Assist with latch;Breast massage;Breast compression  Intervention(s): Hand expression;Skin to skin Intervention(s): Alternate breast massage  Type of Nipple: Everted at rest and after stimulation  Comfort (Breast/Nipple): Filling, red/small blisters or bruises, mild/mod discomfort  Problem noted: Mild/Moderate discomfort Interventions (Filling): Massage;Frequent nursing;Double electric pump Interventions  (Cracked/bleeding/bruising/blister): Expressed breast milk to nipple;Double electric pump Interventions (Mild/moderate discomfort): Post-pump;Breast shields;Hand massage;Hand  expression  Hold (Positioning): Full assist, staff holds infant at breast Intervention(s): Breastfeeding basics reviewed;Support Pillows;Position options;Skin to skin     Lactation Tools Discussed/Used Tools: Shells;Nipple Dorris CarnesShields;Pump Nipple shield size: 20 Shell Type: Inverted Breast pump type: Double-Electric Breast Pump Pump Review: Setup, frequency, and cleaning;Milk Storage Initiated by:: J.W. RN Date initiated:: 03/21/16   Consult Status Consult Status: Follow-up Date: 03/22/16 (in pm) Follow-up type: In-patient    Charyl DancerCARVER, Kang Ishida G 03/22/2016, 4:27 AM

## 2016-03-22 NOTE — Progress Notes (Signed)
PPD # 1 SVD Information for the patient's newborn:  Chelsea Woods, Chelsea Woods [[id number]][[id number]]  female    breast feeding   S:  Reports feeling well, desires DC home             Tolerating po/ No nausea or vomiting             Bleeding is light             Pain controlled with PO meds             Up ad lib / ambulatory / voiding without difficulties        O:  A & O x 3, in no apparent distress              VS:  Vitals:   03/21/16 0652 03/21/16 1030 03/21/16 1900 03/22/16 0528  BP: (!) 101/51 (!) 95/53 (!) 99/57 106/64  Pulse: 79 75 68 82  Resp: 16 16 18 18   Temp: 98.7 F (37.1 C) 97.8 F (36.6 C) 98.1 F (36.7 C) 98.1 F (36.7 C)  TempSrc:  Oral Oral   SpO2: 100%     Weight:      Height:        LABS:  Recent Labs  03/21/16 0205 03/22/16 0540  WBC 9.6 10.8*  HGB 11.8* 10.5*  HCT 34.9* 31.7*  PLT 201 172    Blood type: --/--/B NEG (01/21 0205) newborn Rh pos  Rubella: Immune (06/27 0000)   I&O: I/O last 3 completed shifts: In: -  Out: 350 [Blood:350]          No intake/output data recorded.  Lungs: Clear and unlabored  Heart: regular rate and rhythm / no murmurs  Abdomen: soft, non-tender, non-distended             Fundus: firm, non-tender, U-1  Perineum: mild edema, repair intact  Lochia: small  Extremities: no edema, no calf pain or tenderness    A/P: PPD # 1 30 y.o., [id number]   Principal Problem:   Postpartum care following vbac (1/21) Active Problems:   VBAC, delivered   Rh negative, maternal  - newborn Rh pos - Rhophylac prior to DC  Doing well - stable status  Routine post partum orders  DC home today w/ instructions  [facility] and PRN     Neta Mendsaniela C Adalynn Corne, MSN, CNM 03/22/2016, 8:26 AM

## 2016-03-22 NOTE — Lactation Note (Signed)
This note was copied from a baby's chart. Lactation Consultation Note  Patient Name: Chelsea Woods Reason for consult: Follow-up assessment Mom trying to let baby self attach and c/o of pain with latch. LC worked with Mom on support/positioning so baby will obtain good depth with initial latch. Mom reported less discomfort. Baby initially sleepy at breast but did demonstrate some good suckling bursts with few noted swallows. Mom not using nipple shield. Slight compression noted when baby came off the breast this feeding, no trauma noted.  Advised Mom baby should be at breast 8-12 times in 24 hours and with feeding ques, try to keep baby nursing 15-20 minutes both breasts some feedings, cluster feeding discussed. Mom to apply EBM and using coconut oil prn. Mom reports history of LMS with 33 year old. Advised to post pump for 15 minutes 4 times/day to encourage milk production. Mom to call for assist as needed, questions/concerns.   Maternal Data    Feeding Feeding Type: Breast Fed Length of feed: 20 min  LATCH Score/Interventions Latch: Repeated attempts needed to sustain latch, nipple held in mouth throughout feeding, stimulation needed to elicit sucking reflex. (without nipple shield) Intervention(s): Adjust position;Assist with latch;Breast massage;Breast compression  Audible Swallowing: A few with stimulation  Type of Nipple: Everted at rest and after stimulation  Comfort (Breast/Nipple): Filling, red/small blisters or bruises, mild/mod discomfort  Problem noted: Mild/Moderate discomfort Interventions  (Cracked/bleeding/bruising/blister): Expressed breast milk to nipple (coconut oil prn)  Hold (Positioning): Assistance needed to correctly position infant at breast and maintain latch. Intervention(s): Breastfeeding basics reviewed;Support Pillows;Position options;Skin to skin  LATCH Score: 6  Lactation Tools Discussed/Used Tools: Nipple  Dorris CarnesShields;Pump Nipple shield size: 20 Breast pump type: Double-Electric Breast Pump   Consult Status Consult Status: Follow-up Date: 03/23/16 Follow-up type: In-patient    Alfred LevinsGranger, [name], 2:56 PM

## 2016-03-23 ENCOUNTER — Ambulatory Visit: Payer: Self-pay

## 2016-03-23 LAB — RH IG WORKUP (INCLUDES ABO/RH)
ABO/RH(D): B NEG
Fetal Screen: NEGATIVE
GESTATIONAL AGE(WKS): 40
Unit division: 0

## 2016-03-23 NOTE — Lactation Note (Signed)
This note was copied from a baby's chart. Lactation Consultation Note Mom BF baby in cradle position. Mom states nipples are sore. Noted bruising. Has coconut oil. Mom has short shaft nipples. Was fitted w/NS a day ago, mom feels that she doesn't need them. Has DEBP. Parents feel BF better as far as baby, but nipples are sore. Assessed latch, needed chin tug. Bottom lip tucked inwards towards mouth. Taught mom how to correct latch.  Baby had 8% wt. Loss  At 45 hrs of life. Had 9 voids, 5 stools, is WDL for age of life. Baby appears satisfied at breast. Not frantic. Will need to be again before d/c home by Lactation. Patient Name: Chelsea Woods [id number]'JToday's Date: 03/23/2016 Reason for consult: Follow-up assessment   Maternal Data    Feeding Feeding Type: Breast Fed Length of feed: 15 min (still BF)  LATCH Score/Interventions Latch: Grasps breast easily, tongue down, lips flanged, rhythmical sucking. Intervention(s): Assist with latch;Breast massage  Audible Swallowing: Spontaneous and intermittent Intervention(s): Hand expression;Skin to skin Intervention(s): Alternate breast massage  Type of Nipple: Everted at rest and after stimulation  Comfort (Breast/Nipple): Filling, red/small blisters or bruises, mild/mod discomfort  Problem noted: Mild/Moderate discomfort Interventions (Filling): Double electric pump;Massage Interventions  (Cracked/bleeding/bruising/blister): Double electric pump Interventions (Mild/moderate discomfort): Hand massage;Hand expression  Hold (Positioning): No assistance needed to correctly position infant at breast. Intervention(s): Support Pillows;Position options  LATCH Score: 9  Lactation Tools Discussed/Used Tools: Pump Breast pump type: Double-Electric Breast Pump   Consult Status Consult Status: Follow-up Date: 03/23/16 Follow-up type: In-patient    Charyl DancerCARVER, Brynlyn Dade G 03/23/2016, 7:20 AM

## 2016-03-23 NOTE — Lactation Note (Signed)
This note was copied from a baby's chart. Lactation Consultation Note  Patient Name: Chelsea Woods [id number] Date: 03/23/2016 Reason for consult: Follow-up assessment;Breast/nipple pain;Infant weight loss Baby is 23 hours old , 8% weight loss, 2nd LC visit for today.  Baby hungry , LC reviewed basis of latching to prevent further soreness.  Right nipple - positional strip , left nipple clear, and areola edema both breast ,  LC reviewed use comfort gels , shells , hand pump and #24 Flange a good fit. Sore nipple  And engorgement prevention and tx reviewed. [place] instructed mom on the use hand pump and recommended on the right breast prior to latch - after massage, hand express, pre - pump with hand pump to make the nipple and areola more elastic for a deeper latch and to aid in the sore nipple improving quickly.  LC assisted dad to change stool diaper, and placed baby STS ibn football position. LC stressed the importance of making sure the baby doesn't latch by nibbling onto the breast ,  LC showed mom and dad how to work with the baby to open wide and then latch with breast compressions until swallows and intermittent. Baby fed on the right breast 10 mins and fell asleep. Baby awake 10 mins later and LC assisted to latch on the left breast / football/ and worked  On depth and comfort achieved with assist. Multiple swallows noted , increased with compressions and baby fed 14 mins.  LC discussed nutritive vs non - nutritive sucking patterns and to watch for hanging out at the breast. LC showed mom how to release baby from the breast to prevent further soreness.  LC stressed to mom and dad if the weight isn't stabilizing or increasing by Thursday at [facility] to come back to [facility] and rent a 2 week rental until insurance pump comes in .  [facility] encouraged dad to call insurance company today to get on DEBP Benefit.  LC packed up the DEBP kit for mom , and accessories.  Mother informed of post-discharge support and  given phone number to the lactation department, including services for phone call assistance; out-patient appointments; and breastfeeding support group. List of other breastfeeding resources in the community given in the handout. Encouraged mother to call for problems or concerns related to breastfeeding.  Maternal Data Has patient been taught Hand Expression?: Yes  Feeding Feeding Type: Breast Fed Length of feed: 14 min (multiple swallows, increased with breast compressions)  LATCH Score/Interventions Latch: Grasps breast easily, tongue down, lips flanged, rhythmical sucking. Intervention(s): Adjust position;Assist with latch;Breast massage;Breast compression  Audible Swallowing: Spontaneous and intermittent Intervention(s): Hand expression;Skin to skin Intervention(s): Alternate breast massage  Type of Nipple: Everted at rest and after stimulation  Comfort (Breast/Nipple): Filling, red/small blisters or bruises, mild/mod discomfort  Problem noted: Mild/Moderate discomfort;Cracked, bleeding, blisters, bruises (positional strip on the left nipple ) Interventions (Filling): Double electric pump;Massage Interventions  (Cracked/bleeding/bruising/blister): Double electric pump Interventions (Mild/moderate discomfort): Hand massage;Hand expression  Hold (Positioning): Assistance needed to correctly position infant at breast and maintain latch. Intervention(s): Breastfeeding basics reviewed;Support Pillows;Position options;Skin to skin  LATCH Score: 8  Lactation Tools Discussed/Used Tools: Shells;Pump;Comfort gels Shell Type: Inverted Breast pump type: Manual WIC Program: No   Consult Status Consult Status: Complete Date: 03/23/16 Follow-up type: In-patient    [place] 03/23/2016, 10:14 AM

## 2017-02-08 DIAGNOSIS — Z3201 Encounter for pregnancy test, result positive: Secondary | ICD-10-CM | POA: Diagnosis not present

## 2017-02-08 DIAGNOSIS — N640 Fissure and fistula of nipple: Secondary | ICD-10-CM | POA: Diagnosis not present

## 2017-02-11 DIAGNOSIS — O360111 Maternal care for anti-D [Rh] antibodies, first trimester, fetus 1: Secondary | ICD-10-CM | POA: Diagnosis not present

## 2017-02-11 DIAGNOSIS — Z640 Problems related to unwanted pregnancy: Secondary | ICD-10-CM | POA: Diagnosis not present

## 2017-02-11 DIAGNOSIS — Z3A01 Less than 8 weeks gestation of pregnancy: Secondary | ICD-10-CM | POA: Diagnosis not present

## 2017-02-23 DIAGNOSIS — Z640 Problems related to unwanted pregnancy: Secondary | ICD-10-CM | POA: Diagnosis not present

## 2017-02-23 DIAGNOSIS — Z3009 Encounter for other general counseling and advice on contraception: Secondary | ICD-10-CM | POA: Diagnosis not present

## 2018-02-06 DIAGNOSIS — Z3201 Encounter for pregnancy test, result positive: Secondary | ICD-10-CM | POA: Diagnosis not present

## 2018-02-06 DIAGNOSIS — Z640 Problems related to unwanted pregnancy: Secondary | ICD-10-CM | POA: Diagnosis not present

## 2018-02-10 DIAGNOSIS — Z640 Problems related to unwanted pregnancy: Secondary | ICD-10-CM | POA: Diagnosis not present

## 2018-02-10 DIAGNOSIS — Z6791 Unspecified blood type, Rh negative: Secondary | ICD-10-CM | POA: Diagnosis not present

## 2018-02-20 DIAGNOSIS — Z640 Problems related to unwanted pregnancy: Secondary | ICD-10-CM | POA: Diagnosis not present

## 2018-07-08 IMAGING — US US MFM OB DETAIL+14 WK
1 series · 13 of 28 positions shown · non-contrast
Comparison: none

[Series 1: us mfm ob detail+14 wk · 72 acquisitions, 13 frames shown]
[im 3/72]
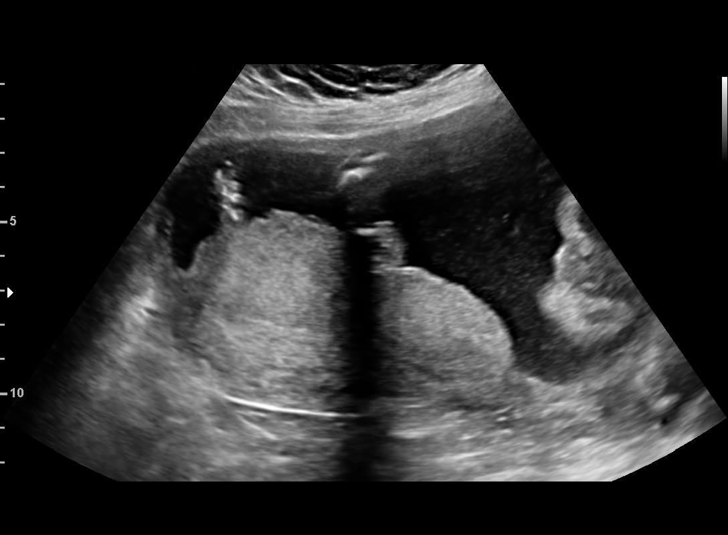
[im 8/72]
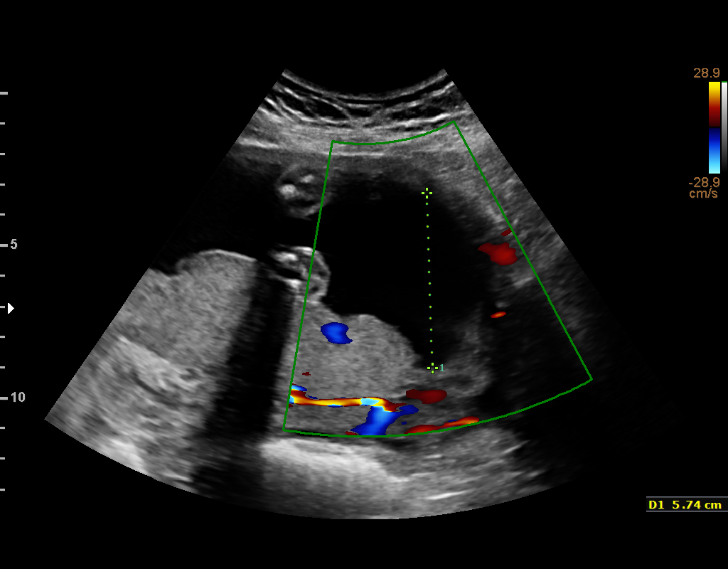
[im 14/72]
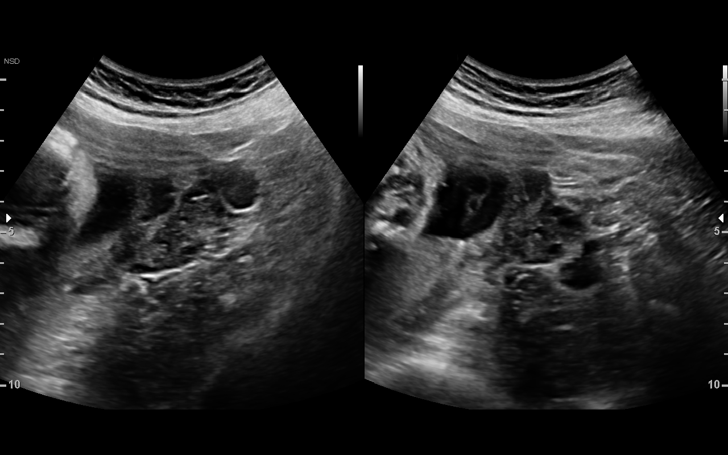
[im 19/72]
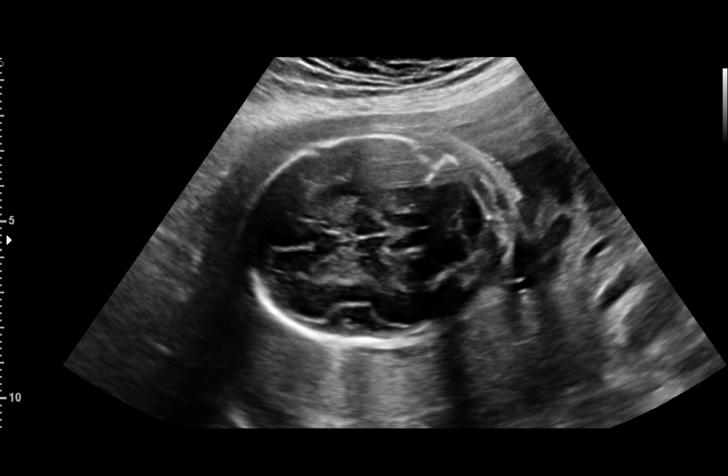
[im 24/72]
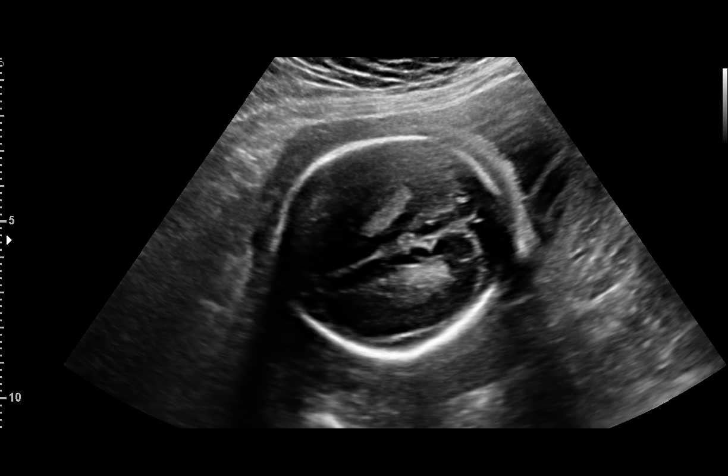
[im 29/72]
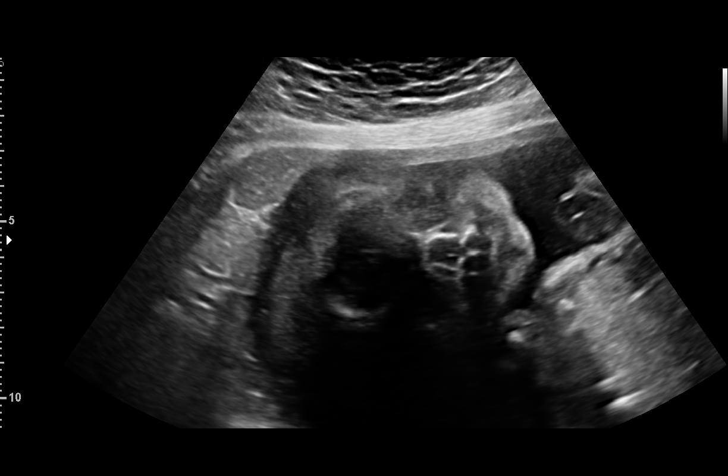
[im 37/72]
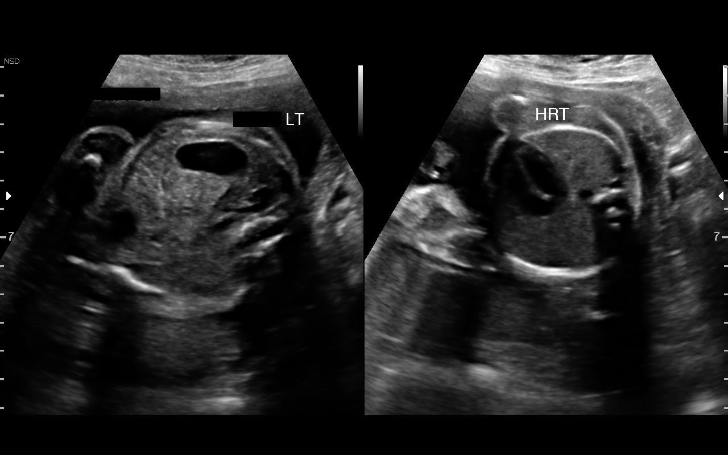
[im 43/72]
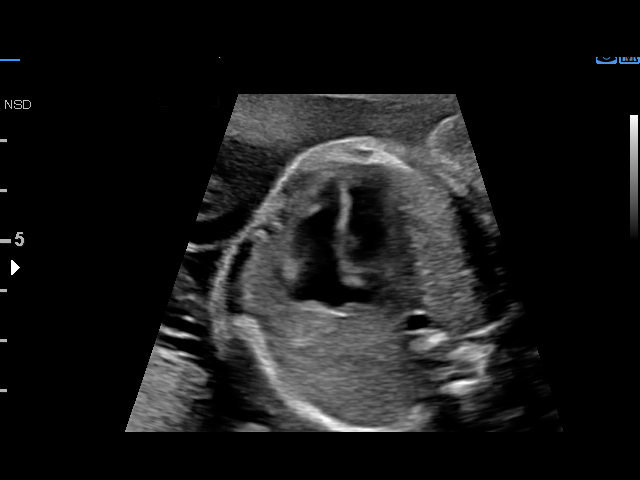
[im 48/72]
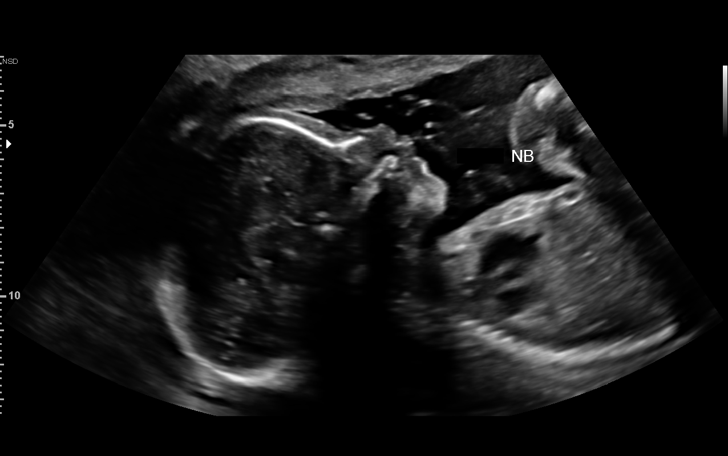
[im 53/72]
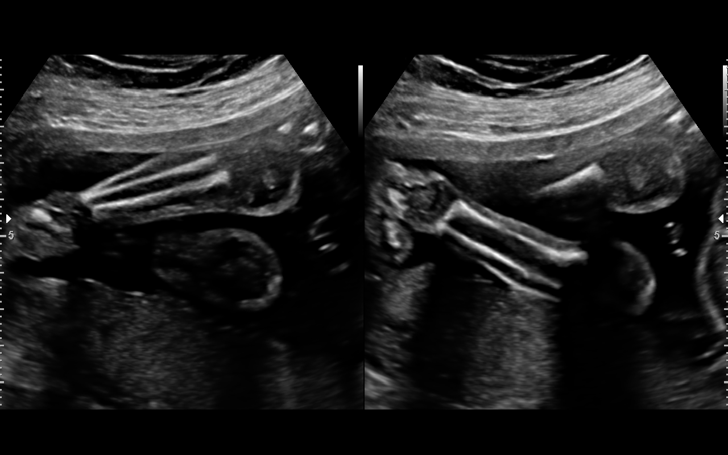
[im 58/72]
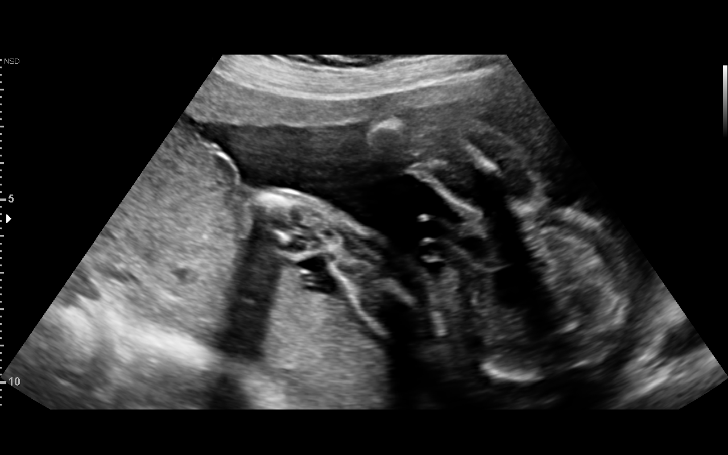
[im 64/72]
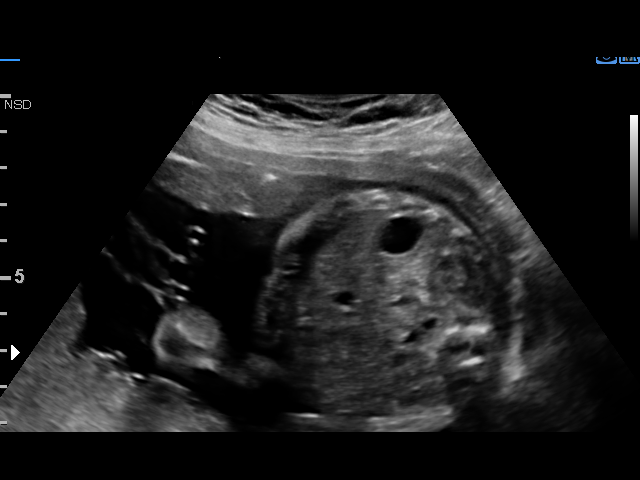
[im 69/72]
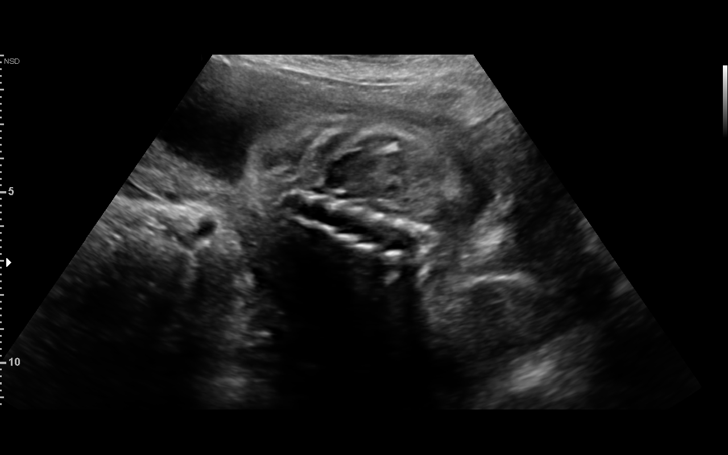

[13 of 28 positions shown; findings below may reference images not displayed]

1  DONGHYEON MIKE            [id number]       [id number]     [id number]
Indications

24 weeks gestation of pregnancy
Encounter for antenatal screening for
malformations
Uterine size-date discrepancy, second
trimester
Hypothyroid
Anemai
Previous cesarean delivery, antepartum
Rh negative state in antepartum
OB History

Gravidity:    3         Term:   1        Prem:   0        SAB:   1
TOP:          0       Ectopic:  0        Living: 1
Fetal Evaluation

Num Of Fetuses:     1
Fetal Heart         151
Rate(bpm):
Cardiac Activity:   Observed
Presentation:       Breech
Placenta:           Posterior, above cervical os
P. Cord Insertion:  Visualized, central

Amniotic Fluid
AFI FV:      Subjectively within normal limits

Largest Pocket(cm)
5.7
Biometry

BPD:      59.5  mm     G. Age:  24w 2d         23  %    CI:        67.36   %   70 - 86
FL/HC:      19.2   %   18.7 -
HC:      232.1  mm     G. Age:  25w 1d         44  %    HC/AC:      1.11       1.04 -
AC:      208.8  mm     G. Age:  25w 3d         59  %    FL/BPD:     74.8   %   71 - 87
FL:       44.5  mm     G. Age:  24w 5d         31  %    FL/AC:      21.3   %   20 - 24
HUM:      42.5  mm     G. Age:  25w 4d         59  %
CER:      26.6  mm     G. Age:  24w 2d         32  %

CM:        5.8  mm

Est. FW:     767  gm    1 lb 11 oz      57  %
Gestational Age

U/S Today:     24w 6d                                        EDD:   03/25/16
Best:          24w 6d    Det. By:   Early Ultrasound         EDD:   03/25/16
(09/09/15)
Anatomy

Cranium:               Appears normal         Aortic Arch:            Not well visualized
Cavum:                 Appears normal         Ductal Arch:            Not well visualized
Ventricles:            Appears normal         Diaphragm:              Appears normal
Choroid Plexus:        Appears normal         Stomach:                Appears normal, left
sided
Cerebellum:            Appears normal         Abdomen:                Appears normal
Posterior Fossa:       Appears normal         Abdominal Wall:         Appears nml (cord
insert, abd wall)
Nuchal Fold:           Not applicable (>20    Cord Vessels:           Appears normal (3
wks GA)                                        vessel cord)
Face:                  Appears normal         Kidneys:                Appear normal
(orbits and profile)
Lips:                  Appears normal         Bladder:                Appears normal
Palate:                Not well visualized    Spine:                  Not well visualized
Heart:                 Appears normal         Upper Extremities:      Appears normal
(4CH, axis, and situs
RVOT:                  Appears normal         Lower Extremities:      Appears normal
LVOT:                  Appears normal

Other:  Fetus appears to be a female. Technically difficult due to fetal position.
Cervix Uterus Adnexa

Cervix
Length:            2.8  cm.
Normal appearance by transabdominal scan.

Left Ovary
Within normal limits.

Right Ovary
Not visualized.

Adnexa:       No abnormality visualized.
Comments

Ms. Loyd was seen today due to concerns for possible fetal
growth restriction vs. dates.  The patient had an uncertain
LMP.  A first trimester screen was performed on [DATE]- at that
time the fetus measured 11w 5d (EDD 03/25/2016).
Ultrasound measurements today are consistent with her first
trimester ultrasound and there is no evidence of fetal growth
restriction.
Impression

Single IUP at 25w 6d
Limited views of the fetal spine, ductal and aortic arches were
obtained
The remainder of the fetal anatomy appears normal
The estimated fetal weight today is at the 57th % tile (based
on first trimester screen)
Posterior placenta without previa
Normal amniotic fluid volume
Recommendations

Recommend follow-up ultrasound examination in 4 weeks to
re-evaluate the fetal spine, ductal and aortic arches.

## 2019-09-24 DIAGNOSIS — Z640 Problems related to unwanted pregnancy: Secondary | ICD-10-CM | POA: Diagnosis not present

## 2019-09-24 DIAGNOSIS — Z3689 Encounter for other specified antenatal screening: Secondary | ICD-10-CM | POA: Diagnosis not present

## 2019-09-24 DIAGNOSIS — Z3200 Encounter for pregnancy test, result unknown: Secondary | ICD-10-CM | POA: Diagnosis not present

## 2019-09-27 DIAGNOSIS — Z640 Problems related to unwanted pregnancy: Secondary | ICD-10-CM | POA: Diagnosis not present

## 2019-09-27 DIAGNOSIS — Z3A08 8 weeks gestation of pregnancy: Secondary | ICD-10-CM | POA: Diagnosis not present

## 2019-09-27 DIAGNOSIS — O360110 Maternal care for anti-D [Rh] antibodies, first trimester, not applicable or unspecified: Secondary | ICD-10-CM | POA: Diagnosis not present

## 2019-10-10 DIAGNOSIS — Z640 Problems related to unwanted pregnancy: Secondary | ICD-10-CM | POA: Diagnosis not present

## 2020-11-05 ENCOUNTER — Ambulatory Visit: Payer: BLUE CROSS/BLUE SHIELD | Admitting: Family Medicine
